# Patient Record
Sex: Male | Born: 1965 | Race: White | Hispanic: Yes | Marital: Married | Smoking: Never smoker
Health system: Southern US, Community
[De-identification: ages and names within clinical notes are randomized; demographics above are authoritative.]

## PROBLEM LIST (undated history)

## (undated) DIAGNOSIS — M65831 Other synovitis and tenosynovitis, right forearm: Secondary | ICD-10-CM

## (undated) DIAGNOSIS — E785 Hyperlipidemia, unspecified: Secondary | ICD-10-CM

## (undated) DIAGNOSIS — Z9189 Other specified personal risk factors, not elsewhere classified: Secondary | ICD-10-CM

## (undated) DIAGNOSIS — K219 Gastro-esophageal reflux disease without esophagitis: Secondary | ICD-10-CM

## (undated) DIAGNOSIS — Z973 Presence of spectacles and contact lenses: Secondary | ICD-10-CM

## (undated) DIAGNOSIS — I10 Essential (primary) hypertension: Secondary | ICD-10-CM

## (undated) HISTORY — DX: Gastro-esophageal reflux disease without esophagitis: K21.9

## (undated) HISTORY — DX: Hyperlipidemia, unspecified: E78.5

---

## 2006-06-06 ENCOUNTER — Ambulatory Visit: Payer: Self-pay | Admitting: Internal Medicine

## 2006-08-31 ENCOUNTER — Ambulatory Visit: Payer: Self-pay | Admitting: Internal Medicine

## 2006-09-29 ENCOUNTER — Ambulatory Visit: Payer: Self-pay | Admitting: Internal Medicine

## 2006-10-24 ENCOUNTER — Ambulatory Visit: Payer: Self-pay | Admitting: Internal Medicine

## 2006-11-16 ENCOUNTER — Ambulatory Visit: Payer: Self-pay | Admitting: Internal Medicine

## 2006-11-26 ENCOUNTER — Emergency Department (HOSPITAL_COMMUNITY): Admission: EM | Admit: 2006-11-26 | Discharge: 2006-11-26 | Payer: Self-pay | Admitting: Emergency Medicine

## 2007-01-03 ENCOUNTER — Ambulatory Visit: Payer: Self-pay | Admitting: Internal Medicine

## 2007-09-22 ENCOUNTER — Ambulatory Visit: Payer: Self-pay | Admitting: Family Medicine

## 2008-05-14 ENCOUNTER — Ambulatory Visit: Payer: Self-pay | Admitting: Internal Medicine

## 2008-05-14 DIAGNOSIS — K219 Gastro-esophageal reflux disease without esophagitis: Secondary | ICD-10-CM | POA: Insufficient documentation

## 2008-08-25 ENCOUNTER — Ambulatory Visit: Payer: Self-pay | Admitting: Internal Medicine

## 2008-10-17 HISTORY — PX: CARPAL TUNNEL RELEASE: SHX101

## 2008-12-09 ENCOUNTER — Ambulatory Visit: Payer: Self-pay | Admitting: Internal Medicine

## 2008-12-09 DIAGNOSIS — R209 Unspecified disturbances of skin sensation: Secondary | ICD-10-CM | POA: Insufficient documentation

## 2008-12-09 DIAGNOSIS — M79609 Pain in unspecified limb: Secondary | ICD-10-CM

## 2008-12-16 ENCOUNTER — Encounter (INDEPENDENT_AMBULATORY_CARE_PROVIDER_SITE_OTHER): Payer: Self-pay | Admitting: *Deleted

## 2008-12-16 LAB — CONVERTED CEMR LAB
Calcium: 9.7 mg/dL (ref 8.4–10.5)
Creatinine, Ser: 0.9 mg/dL (ref 0.4–1.5)
Folate: 8.3 ng/mL
GFR calc non Af Amer: 97 mL/min
Glucose, Bld: 94 mg/dL (ref 70–99)
Sodium: 141 meq/L (ref 135–145)
TSH: 2.54 microintl units/mL (ref 0.35–5.50)

## 2010-08-11 ENCOUNTER — Ambulatory Visit: Payer: Self-pay | Admitting: Internal Medicine

## 2010-08-11 ENCOUNTER — Encounter: Payer: Self-pay | Admitting: Internal Medicine

## 2010-08-12 ENCOUNTER — Ambulatory Visit: Payer: Self-pay | Admitting: Internal Medicine

## 2010-08-18 LAB — CONVERTED CEMR LAB
AST: 28 units/L (ref 0–37)
BUN: 22 mg/dL (ref 6–23)
Basophils Absolute: 0 10*3/uL (ref 0.0–0.1)
CO2: 28 meq/L (ref 19–32)
Calcium: 9.2 mg/dL (ref 8.4–10.5)
Direct LDL: 162.8 mg/dL
Eosinophils Absolute: 0.1 10*3/uL (ref 0.0–0.7)
Eosinophils Relative: 2 % (ref 0.0–5.0)
HDL: 39.4 mg/dL (ref >39.00)
Hemoglobin: 14.7 g/dL (ref 13.0–17.0)
Neutrophils Relative %: 52.7 % (ref 43.0–77.0)
RDW: 14.1 % (ref 11.5–14.6)
Sodium: 136 meq/L (ref 135–145)
Total CHOL/HDL Ratio: 6
Triglycerides: 120 mg/dL (ref 0.0–149.0)
VLDL: 24 mg/dL (ref 0.0–40.0)

## 2010-11-16 NOTE — Assessment & Plan Note (Signed)
Summary: CPX///SPH   Vital Signs:  Patient profile:   45 year old male Height:      68 inches Weight:      192.50 pounds BMI:     29.38 Pulse rate:   66 / minute Pulse rhythm:   regular BP sitting:   127 / 76  Vitals Entered By: Army Fossa CMA (August 11, 2010 3:05 PM) CC: CPX, not fasting Comments discuss TD declines flu shot    History of Present Illness: CPX   Preventive Screening-Counseling & Management  Alcohol-Tobacco     Smoking Status: never  Caffeine-Diet-Exercise     Does Patient Exercise: yes     Type of exercise: active at work  Current Medications (verified): 1)  None  Allergies (verified): No Known Drug Allergies  Past History:  Past Medical History: Reviewed history from 08/25/2008 and no changes required. no  Past Surgical History: R wrist surgery 2010 (?CTS)  Family History: DM-- GPs, uncles  HTN-- GPs, uncles  MI-- ? colon ca-- no prostate ca-- no  Social History: original from [place] married children x 1  tobacco--no ETOH-- rarely diet-- low salt otherwise regular  exercise-- vey active at work, no regular exercise.  Works in [employer] Smoking Status:  never Does Patient Exercise:  yes  Review of Systems General:  Denies fatigue, fever, and weight loss. CV:  Denies chest pain or discomfort, palpitations, and swelling of feet. Resp:  Denies cough and shortness of breath. GI:  Denies bloody stools, diarrhea, nausea, and vomiting. GU:  Denies dysuria and hematuria. Psych:  Denies anxiety and depression.  Physical Exam  General:  alert, well-developed, and well-nourished.   Neck:  no masses, no thyromegaly, and normal carotid upstroke.   Lungs:  normal respiratory effort, no intercostal retractions, no accessory muscle use, and normal breath sounds.   Heart:  normal rate, regular rhythm, no murmur, and no gallop.   Abdomen:  soft, non-tender, no distention, no masses, no guarding, and no rigidity.   Extremities:   no lower extremity edema Psych:  Oriented X3, memory intact for recent and remote, normally interactive, good eye contact, not anxious appearing, and not depressed appearing.     Impression & Recommendations:  Problem # 1:  ROUTINE GENERAL MEDICAL EXAM@HEALTH  CARE FACL (ICD-V70.0)  Td-- today flu shot-- declined , explained benefites   never had a colonoscopy  Exercise discussed Labs EKG--nsr   Orders: EKG w/ Interpretation (93000)  Other Orders: Tdap => 99yrs IM (16109) Admin 1st Vaccine (60454)  Patient Instructions: 1)  came back  fasting 2)  FLP, CBC, BMP, TSH, AST, ALT--- v70 3)  Please schedule a follow-up appointment in 1 year.    Orders Added: 1)  Tdap => 75yrs IM [90715] 2)  Admin 1st Vaccine [90471] 3)  EKG w/ Interpretation [93000] 4)  Est. Patient age 6-64 [54]   Immunizations Administered:  Tetanus Vaccine:    Vaccine Type: Tdap    Site: right deltoid    Mfr: GlaxoSmithKline    Dose: 0.5 ml    Route: IM    Given by: Army Fossa CMA    Exp. Date: 08/05/2012    Lot #: [id number]   Immunizations Administered:  Tetanus Vaccine:    Vaccine Type: Tdap    Site: right deltoid    Mfr: GlaxoSmithKline    Dose: 0.5 ml    Route: IM    Given by: Army Fossa CMA    Exp. Date: 08/05/2012    Lot #: [id number]  Risk Factors:  Tobacco use:  never Alcohol use:  no Exercise:  yes    Type:  active at work

## 2011-03-04 NOTE — Assessment & Plan Note (Signed)
[facility]                          [facility] OFFICE NOTE   WILBURN, KEIR                         MRN:          [id number]  DATE:06/06/2006                            DOB:          [birth year]    CHIEF COMPLAINT:  Hand numbness.   HISTORY OF PRESENT ILLNESS:  Mr. Grima is a 45 year old Hispanic male,  originally from [place], who came to the office with a one-year history of  bilateral hand cramps and numbness.  These have been mostly when he is at  work and also occasionally at night.  He works as a Corporate investment banker.  Also, he has noticed diarrhea on and off.  It is mostly related to milk, and  it can be severe and watery.  He has not symptoms with cheese or other dairy  products.   PAST MEDICAL HISTORY:  The only surgery he had before is a correction of a  deviated septum.   FAMILY HISTORY:  1. Denies any history of colon or prostate cancer.  No coronary artery      disease.  2. Grandpas have history of hypertension and diabetes.   SOCIAL HISTORY:  Does not smoke and drinks socially.  He is married and has  2 kids of his own.   REVIEW OF SYSTEMS:  He denies any elbow or wrist pain.  No blood in the  stools or abdominal pain.   MEDICATIONS:  None.   ALLERGIES:  No known drug allergies.   PHYSICAL EXAMINATION:  GENERAL:  The patient is alert and oriented, in no  apparent distress.  VITAL SIGNS:  He is 5 feet 7 inches tall, he weighs 195 pounds.  Blood  pressure 140/92 on the left arm, pulse 78.  LUNGS:  Clear to auscultation bilaterally.  CARDIOVASCULAR:  Regular rate and rhythm without a murmur.  EXTREMITIES:  There is no wrist or hand puffiness, deformation or swelling.  Range of motion of the elbows and wrists is normal.  There are no  subcutaneous nodules on the upper extremities.  I did a percussion in the  wrists and he did feel some paresthesias when I did that.  Hyperextension of  the wrists did not reproduce the  numbness on the hands.   ASSESSMENT AND PLAN:  1. The patient has paresthesias.  The symptoms are somehow atypical for      carpal tunnel syndrome as the paresthesias are in all fingers.      Nevertheless, I recommend him to use splints on his wrists at night and      as much as he can in the daytime.  He is to let me know if that does      not help.  2. He has lactose intolerance by history.  I advised him to take Lactaid      whenever he drinks milk or certain foods that he knows are going to      cause diarrhea.  3. His blood pressure is slightly elevated today.  I recommend him to have      a complete physical at some point  in the next few months.                                   Willow Ora, MD   JP/MedQ  DD:  06/06/2006  DT:  06/07/2006  Job #:  [id number]

## 2011-04-02 ENCOUNTER — Emergency Department (HOSPITAL_COMMUNITY): Payer: BC Managed Care – PPO

## 2011-04-02 ENCOUNTER — Emergency Department (HOSPITAL_COMMUNITY)
Admission: EM | Admit: 2011-04-02 | Discharge: 2011-04-03 | Disposition: A | Discharge status: Home / Self Care | Payer: BC Managed Care – PPO | Attending: Emergency Medicine | Admitting: Emergency Medicine

## 2011-04-02 DIAGNOSIS — S61509A Unspecified open wound of unspecified wrist, initial encounter: Secondary | ICD-10-CM | POA: Insufficient documentation

## 2011-04-02 DIAGNOSIS — Y9289 Other specified places as the place of occurrence of the external cause: Secondary | ICD-10-CM | POA: Insufficient documentation

## 2011-04-02 DIAGNOSIS — M25539 Pain in unspecified wrist: Secondary | ICD-10-CM | POA: Insufficient documentation

## 2011-04-02 DIAGNOSIS — M79609 Pain in unspecified limb: Secondary | ICD-10-CM | POA: Insufficient documentation

## 2011-04-02 DIAGNOSIS — S6000XA Contusion of unspecified finger without damage to nail, initial encounter: Secondary | ICD-10-CM | POA: Insufficient documentation

## 2012-10-17 HISTORY — PX: NASAL SEPTUM SURGERY: SHX37

## 2013-03-13 ENCOUNTER — Ambulatory Visit: Payer: BC Managed Care – PPO | Admitting: Internal Medicine

## 2013-06-03 ENCOUNTER — Ambulatory Visit: Payer: BC Managed Care – PPO | Admitting: Internal Medicine

## 2013-06-07 ENCOUNTER — Ambulatory Visit: Payer: BC Managed Care – PPO | Admitting: Internal Medicine

## 2014-05-29 ENCOUNTER — Encounter (HOSPITAL_BASED_OUTPATIENT_CLINIC_OR_DEPARTMENT_OTHER): Payer: Self-pay | Admitting: *Deleted

## 2014-05-29 NOTE — Progress Notes (Signed)
05/29/14 1654  OBSTRUCTIVE SLEEP APNEA  Have you ever been diagnosed with sleep apnea through a sleep study? No  Do you snore loudly (loud enough to be heard through closed doors)?  1  Do you often feel tired, fatigued, or sleepy during the daytime? 0  Has anyone observed you stop breathing during your sleep? 0  Do you have, or are you being treated for high blood pressure? 1  BMI more than 35 kg/m2? 0  Age over 50 years old? 0  Neck circumference greater than 40 cm/16 inches? 1  Gender: 1  Obstructive Sleep Apnea Score 4  Score 4 or greater  Results sent to PCP

## 2014-05-29 NOTE — Progress Notes (Signed)
NPO AFTER MN. ARRIVE AT 1100. NEEDS ISTAT AND EKG.

## 2014-06-04 NOTE — Brief Op Note (Signed)
06/05/2014  9:18 PM  PATIENT:  Jerry Sims  48 y.o. male  PRE-OPERATIVE DIAGNOSIS:  RIGHT WRIST EXTENSOR TENOSYNOVITIS  POST-OPERATIVE DIAGNOSIS:  * No post-op diagnosis entered *  PROCEDURE:  Procedure(s):  RIGHT WRIST FIRST/SECOND DORSAL COMPARTMENT TENOSYNOVECTOMY (Right)  SURGEON:  Surgeon(s) and Role:    * Linna Hoff, MD - Primary  PHYSICIAN ASSISTANT:   ASSISTANTS: none   ANESTHESIA:   general  EBL:     BLOOD ADMINISTERED:none  DRAINS: none   LOCAL MEDICATIONS USED:  MARCAINE     SPECIMEN:  No Specimen  DISPOSITION OF SPECIMEN:  N/A  COUNTS:  YES  TOURNIQUET:  * No tourniquets in log *  DICTATION: .Other Dictation: Dictation Number [id number]  PLAN OF CARE: Discharge to home after PACU  PATIENT DISPOSITION:  PACU - hemodynamically stable.   Delay start of Pharmacological VTE agent (>24hrs) due to surgical blood loss or risk of bleeding: not applicable

## 2014-06-04 NOTE — H&P (Signed)
Jerry Sims is an 48 y.o. male.   Chief Complaint: Right wrist dorsal radial wrist pain HPI: Pt with persistent dorsal radial wrist pain Pt has been followed in office Pt here for surgery for persistent pain  Past Medical History  Diagnosis Date  . Extensor tenosynovitis of right wrist   . Hypertension   . Wears glasses   . At risk for sleep apnea     STOP-BANG= 4     SENT TO PCP 05-29-2014    Past Surgical History  Procedure Laterality Date  . Carpal tunnel release Right 2010  . Nasal septum surgery  2014    History reviewed. No pertinent family history. Social History:  reports that he has never smoked. He has never used smokeless tobacco. He reports that he drinks alcohol. He reports that he does not use illicit drugs.  Allergies: No Known Allergies  No prescriptions prior to admission    No results found for this or any previous visit (from the past 48 hour(s)). No results found.  ROS NO RECENT ILLNESSES OR HOSPITALIZATIONS  Height 5\' 5"  (1.651 m), weight 81.647 kg (180 lb). Physical Exam  General Appearance:  Alert, cooperative, no distress, appears stated age  Head:  Normocephalic, without obvious abnormality, atraumatic  Eyes:  Pupils equal, conjunctiva/corneas clear,         Throat: Lips, mucosa, and tongue normal; teeth and gums normal  Neck: No visible masses     Lungs:   respirations unlabored  Chest Wall:  No tenderness or deformity  Heart:  Regular rate and rhythm,  Abdomen:   Soft, non-tender,         Extremities: RIGHT WRIST: TTP OVER FIRST DORSAL COMPARTMENT, AND INTERSECTION REGION ABLE TO EXTEND THUMB. FINGERS WARM WELL PERFUSED GOOD WRIST MOBILITY  Pulses: 2+ and symmetric  Skin: Skin color, texture, turgor normal, no rashes or lesions     Neurologic: Normal    Assessment/Plan RIGHT WRIST FIRST DORSAL COMPARTMENT AND SECOND DORSAL COMPARTMENT TENOSYNOVITIS  RIGHT WRIST TENOSYNOVITIS  RIGHT WRIST TENOSYNOVECTOMY  R/B/A DISCUSSED  WITH PT IN OFFICE.  PT VOICED UNDERSTANDING OF PLAN CONSENT SIGNED DAY OF SURGERY PT SEEN AND EXAMINED PRIOR TO OPERATIVE PROCEDURE/DAY OF SURGERY SITE MARKED. QUESTIONS ANSWERED WILL GO HOME FOLLOWING SURGERY  WE ARE PLANNING SURGERY FOR YOUR UPPER EXTREMITY. THE RISKS AND BENEFITS OF SURGERY INCLUDE BUT NOT LIMITED TO BLEEDING INFECTION, DAMAGE TO NEARBY NERVES ARTERIES TENDONS, FAILURE OF SURGERY TO ACCOMPLISH ITS INTENDED GOALS, PERSISTENT SYMPTOMS AND NEED FOR FURTHER SURGICAL INTERVENTION. WITH THIS IN MIND WE WILL PROCEED. I HAVE DISCUSSED WITH THE PATIENT THE PRE AND POSTOPERATIVE REGIMEN AND THE DOS AND DON'TS. PT VOICED UNDERSTANDING AND INFORMED CONSENT SIGNED.  Linna Hoff 06/05/2014 AT 1245

## 2014-06-05 ENCOUNTER — Ambulatory Visit (HOSPITAL_BASED_OUTPATIENT_CLINIC_OR_DEPARTMENT_OTHER): Payer: 59 | Admitting: Anesthesiology

## 2014-06-05 ENCOUNTER — Encounter (HOSPITAL_BASED_OUTPATIENT_CLINIC_OR_DEPARTMENT_OTHER): Payer: Self-pay

## 2014-06-05 ENCOUNTER — Ambulatory Visit (HOSPITAL_BASED_OUTPATIENT_CLINIC_OR_DEPARTMENT_OTHER)
Admission: RE | Admit: 2014-06-05 | Discharge: 2014-06-05 | Disposition: A | Discharge status: Home / Self Care | Payer: 59 | Source: Ambulatory Visit | Attending: Orthopedic Surgery | Admitting: Orthopedic Surgery

## 2014-06-05 ENCOUNTER — Encounter (HOSPITAL_BASED_OUTPATIENT_CLINIC_OR_DEPARTMENT_OTHER)
Admission: RE | Disposition: A | Discharge status: Home / Self Care | Payer: Self-pay | Source: Ambulatory Visit | Attending: Orthopedic Surgery

## 2014-06-05 ENCOUNTER — Encounter (HOSPITAL_BASED_OUTPATIENT_CLINIC_OR_DEPARTMENT_OTHER): Payer: 59 | Admitting: Anesthesiology

## 2014-06-05 DIAGNOSIS — M65839 Other synovitis and tenosynovitis, unspecified forearm: Secondary | ICD-10-CM | POA: Diagnosis not present

## 2014-06-05 DIAGNOSIS — M65849 Other synovitis and tenosynovitis, unspecified hand: Secondary | ICD-10-CM | POA: Diagnosis present

## 2014-06-05 DIAGNOSIS — I10 Essential (primary) hypertension: Secondary | ICD-10-CM | POA: Insufficient documentation

## 2014-06-05 DIAGNOSIS — K219 Gastro-esophageal reflux disease without esophagitis: Secondary | ICD-10-CM | POA: Diagnosis not present

## 2014-06-05 DIAGNOSIS — M65831 Other synovitis and tenosynovitis, right forearm: Secondary | ICD-10-CM

## 2014-06-05 HISTORY — DX: Presence of spectacles and contact lenses: Z97.3

## 2014-06-05 HISTORY — DX: Essential (primary) hypertension: I10

## 2014-06-05 HISTORY — PX: DORSAL COMPARTMENT RELEASE: SHX5039

## 2014-06-05 HISTORY — DX: Other synovitis and tenosynovitis, right forearm: M65.831

## 2014-06-05 HISTORY — DX: Other specified personal risk factors, not elsewhere classified: Z91.89

## 2014-06-05 LAB — POCT I-STAT 4, (NA,K, GLUC, HGB,HCT)
GLUCOSE: 115 mg/dL — AB (ref 70–99)
Glucose, Bld: 115 mg/dL — ABNORMAL HIGH (ref 70–99)
HCT: 45 % (ref 39.0–52.0)
HEMATOCRIT: 49 % (ref 39.0–52.0)
Hemoglobin: 15.3 g/dL (ref 13.0–17.0)
Hemoglobin: 16.7 g/dL (ref 13.0–17.0)
POTASSIUM: 4 meq/L (ref 3.7–5.3)
Potassium: 6.1 mEq/L — ABNORMAL HIGH (ref 3.7–5.3)
SODIUM: 142 meq/L (ref 137–147)
Sodium: 137 mEq/L (ref 137–147)

## 2014-06-05 SURGERY — RELEASE, FIRST DORSAL COMPARTMENT, HAND
Anesthesia: General | Site: Wrist | Laterality: Right

## 2014-06-05 MED ORDER — BUPIVACAINE HCL (PF) 0.25 % IJ SOLN
INTRAMUSCULAR | Status: DC | PRN
  Administered 2014-06-05: 11 mL

## 2014-06-05 MED ORDER — MIDAZOLAM HCL 5 MG/5ML IJ SOLN
INTRAMUSCULAR | Status: DC | PRN
  Administered 2014-06-05: 2 mg via INTRAVENOUS

## 2014-06-05 MED ORDER — CHLORHEXIDINE GLUCONATE 4 % EX LIQD
60.0000 mL | Freq: Once | CUTANEOUS | Status: DC
  Filled 2014-06-05: qty 60

## 2014-06-05 MED ORDER — FENTANYL CITRATE 0.05 MG/ML IJ SOLN
INTRAMUSCULAR | Status: DC | PRN
  Administered 2014-06-05 (×2): 50 ug via INTRAVENOUS
  Administered 2014-06-05 (×2): 25 ug via INTRAVENOUS

## 2014-06-05 MED ORDER — PROPOFOL 10 MG/ML IV BOLUS
INTRAVENOUS | Status: DC | PRN
  Administered 2014-06-05: 200 mg via INTRAVENOUS

## 2014-06-05 MED ORDER — FENTANYL CITRATE 0.05 MG/ML IJ SOLN
INTRAMUSCULAR | Status: AC
  Filled 2014-06-05: qty 4

## 2014-06-05 MED ORDER — ONDANSETRON HCL 4 MG/2ML IJ SOLN
INTRAMUSCULAR | Status: DC | PRN
  Administered 2014-06-05: 4 mg via INTRAVENOUS

## 2014-06-05 MED ORDER — HYDROCODONE-ACETAMINOPHEN 5-325 MG PO TABS
ORAL_TABLET | ORAL | Status: AC
  Filled 2014-06-05: qty 1

## 2014-06-05 MED ORDER — CEFAZOLIN SODIUM-DEXTROSE 2-3 GM-% IV SOLR
2.0000 g | INTRAVENOUS | Status: AC
  Administered 2014-06-05: 2 g via INTRAVENOUS
  Filled 2014-06-05: qty 50

## 2014-06-05 MED ORDER — HYDROCODONE-ACETAMINOPHEN 5-300 MG PO TABS: 1.0000 | ORAL_TABLET | Freq: Four times a day (QID) | ORAL | Status: DC | PRN

## 2014-06-05 MED ORDER — DEXAMETHASONE SODIUM PHOSPHATE 4 MG/ML IJ SOLN
INTRAMUSCULAR | Status: DC | PRN
  Administered 2014-06-05: 10 mg via INTRAVENOUS

## 2014-06-05 MED ORDER — MIDAZOLAM HCL 2 MG/2ML IJ SOLN
INTRAMUSCULAR | Status: AC
  Filled 2014-06-05: qty 2

## 2014-06-05 MED ORDER — LIDOCAINE HCL (CARDIAC) 20 MG/ML IV SOLN
INTRAVENOUS | Status: DC | PRN
  Administered 2014-06-05: 100 mg via INTRAVENOUS

## 2014-06-05 MED ORDER — LACTATED RINGERS IV SOLN
INTRAVENOUS | Status: DC
  Administered 2014-06-05: 12:00:00 via INTRAVENOUS
  Filled 2014-06-05: qty 1000

## 2014-06-05 MED ORDER — HYDROCODONE-ACETAMINOPHEN 5-325 MG PO TABS
1.0000 | ORAL_TABLET | ORAL | Status: DC | PRN
  Administered 2014-06-05: 1 via ORAL
  Filled 2014-06-05: qty 1

## 2014-06-05 SURGICAL SUPPLY — 57 items
APL SKNCLS STERI-STRIP NONHPOA (GAUZE/BANDAGES/DRESSINGS) ×1
BANDAGE ELASTIC 3 VELCRO ST LF (GAUZE/BANDAGES/DRESSINGS) ×3 IMPLANT
BENZOIN TINCTURE PRP APPL 2/3 (GAUZE/BANDAGES/DRESSINGS) ×2 IMPLANT
BLADE SURG 15 STRL LF DISP TIS (BLADE) ×1 IMPLANT
BLADE SURG 15 STRL SS (BLADE) ×3
BNDG CMPR 9X4 STRL LF SNTH (GAUZE/BANDAGES/DRESSINGS) ×1
BNDG CMPR MD 5X2 ELC HKLP STRL (GAUZE/BANDAGES/DRESSINGS) ×1
BNDG CONFORM 3 STRL LF (GAUZE/BANDAGES/DRESSINGS) ×3 IMPLANT
BNDG ELASTIC 2 VLCR STRL LF (GAUZE/BANDAGES/DRESSINGS) ×2 IMPLANT
BNDG ESMARK 4X9 LF (GAUZE/BANDAGES/DRESSINGS) ×3 IMPLANT
CLOSURE WOUND 1/2 X4 (GAUZE/BANDAGES/DRESSINGS) ×1
CORDS BIPOLAR (ELECTRODE) ×3 IMPLANT
COVER TABLE BACK 60X90 (DRAPES) ×3 IMPLANT
CUFF TOURNIQUET SINGLE 18IN (TOURNIQUET CUFF) ×2 IMPLANT
DRAPE EXTREMITY T 121X128X90 (DRAPE) ×3 IMPLANT
DRAPE LG THREE QUARTER DISP (DRAPES) ×3 IMPLANT
DRAPE SURG 17X23 STRL (DRAPES) ×5 IMPLANT
DRSG EMULSION OIL 3X3 NADH (GAUZE/BANDAGES/DRESSINGS) IMPLANT
GAUZE XEROFORM 1X8 LF (GAUZE/BANDAGES/DRESSINGS) ×3 IMPLANT
GLOVE BIO SURGEON STRL SZ8 (GLOVE) ×3 IMPLANT
GLOVE BIOGEL M 6.5 STRL (GLOVE) ×2 IMPLANT
GLOVE BIOGEL PI IND STRL 6.5 (GLOVE) IMPLANT
GLOVE BIOGEL PI IND STRL 7.0 (GLOVE) IMPLANT
GLOVE BIOGEL PI IND STRL 8.5 (GLOVE) ×1 IMPLANT
GLOVE BIOGEL PI INDICATOR 6.5 (GLOVE) ×2
GLOVE BIOGEL PI INDICATOR 7.0 (GLOVE) ×2
GLOVE BIOGEL PI INDICATOR 8.5 (GLOVE) ×2
GOWN STRL REUS W/ TWL LRG LVL3 (GOWN DISPOSABLE) ×1 IMPLANT
GOWN STRL REUS W/ TWL XL LVL3 (GOWN DISPOSABLE) ×1 IMPLANT
GOWN STRL REUS W/TWL LRG LVL3 (GOWN DISPOSABLE) ×3
GOWN STRL REUS W/TWL XL LVL3 (GOWN DISPOSABLE) ×3
KNIFE CARPAL TUNNEL (BLADE) IMPLANT
NDL HYPO 25X1 1.5 SAFETY (NEEDLE) ×2 IMPLANT
NDL SAFETY ECLIPSE 18X1.5 (NEEDLE) IMPLANT
NEEDLE HYPO 18GX1.5 SHARP (NEEDLE)
NEEDLE HYPO 25X1 1.5 SAFETY (NEEDLE) ×6 IMPLANT
NS IRRIG 500ML POUR BTL (IV SOLUTION) ×3 IMPLANT
PACK BASIN DAY SURGERY FS (CUSTOM PROCEDURE TRAY) ×3 IMPLANT
PAD ALCOHOL SWAB (MISCELLANEOUS) ×4 IMPLANT
PAD CAST 3X4 CTTN HI CHSV (CAST SUPPLIES) IMPLANT
PADDING CAST ABS 3INX4YD NS (CAST SUPPLIES) ×2
PADDING CAST ABS COTTON 3X4 (CAST SUPPLIES) IMPLANT
PADDING CAST COTTON 3X4 STRL (CAST SUPPLIES) ×3
PADDING UNDERCAST 2 STRL (CAST SUPPLIES) ×2
PADDING UNDERCAST 2X4 STRL (CAST SUPPLIES) IMPLANT
SPLINT PLASTER CAST XFAST 3X15 (CAST SUPPLIES) IMPLANT
SPLINT PLASTER XTRA FASTSET 3X (CAST SUPPLIES) ×2
SPONGE GAUZE 4X4 12PLY STER LF (GAUZE/BANDAGES/DRESSINGS) ×3 IMPLANT
STOCKINETTE 4X48 STRL (DRAPES) ×3 IMPLANT
STRIP CLOSURE SKIN 1/2X4 (GAUZE/BANDAGES/DRESSINGS) ×1 IMPLANT
SUT MON AB-0 CT1 36 (SUTURE) ×2 IMPLANT
SUT PROLENE 4 0 PS 2 18 (SUTURE) ×3 IMPLANT
SYR BULB 3OZ (MISCELLANEOUS) ×3 IMPLANT
SYR CONTROL 10ML LL (SYRINGE) ×6 IMPLANT
TOWEL OR 17X24 6PK STRL BLUE (TOWEL DISPOSABLE) ×3 IMPLANT
TRAY DSU PREP LF (CUSTOM PROCEDURE TRAY) ×3 IMPLANT
UNDERPAD 30X30 INCONTINENT (UNDERPADS AND DIAPERS) ×3 IMPLANT

## 2014-06-05 NOTE — Transfer of Care (Signed)
Immediate Anesthesia Transfer of Care Note  Patient: Jerry Sims  Procedure(s) Performed: Procedure(s) (LRB):  RIGHT WRIST FIRST/SECOND DORSAL COMPARTMENT TENOSYNOVECTOMY (Right)  Patient Location: PACU  Anesthesia Type: General  Level of Consciousness: awake, alert  and oriented  Airway & Oxygen Therapy: Patient Spontanous Breathing and Patient connected to nasal cannula oxygen  Post-op Assessment: Report given to PACU RN and Post -op Vital signs reviewed and stable  Post vital signs: Reviewed and stable  Complications: No apparent anesthesia complications

## 2014-06-05 NOTE — Discharge Instructions (Addendum)
KEEP BANDAGE CLEAN AND DRY CALL OFFICE FOR F/U APPT [phone] KEEP HAND ELEVATED ABOVE HEART OK TO APPLY ICE TO OPERATIVE AREA CONTACT OFFICE IF ANY WORSENING PAIN OR CONCERNS.  Post Anesthesia Home Care Instructions  Activity: Get plenty of rest for the remainder of the day. A responsible adult should stay with you for 24 hours following the procedure.  For the next 24 hours, DO NOT: -Drive a car -Paediatric nurse -Drink alcoholic beverages -Take any medication unless instructed by your physician -Make any legal decisions or sign important papers.  Meals: Start with liquid foods such as gelatin or soup. Progress to regular foods as tolerated. Avoid greasy, spicy, heavy foods. If nausea and/or vomiting occur, drink only clear liquids until the nausea and/or vomiting subsides. Call your physician if vomiting continues.  Special Instructions/Symptoms: Your throat may feel dry or sore from the anesthesia or the breathing tube placed in your throat during surgery. If this causes discomfort, gargle with warm salt water. The discomfort should disappear within 24 hours.       HAND SURGERY    HOME CARE INSTRUCTIONS    The following instructions have been prepared to help you care for yourself upon your return home today.  Wound Care:  Keep your hand elevated above the level of your heart. Do not allow it to dangle by your side. Keep the dressing dry and do not remove it unless your doctor advises you to do so. He will usually change it at the time of you post-op visit. Moving your fingers is advised to stimulate circulation but will depend on the site of your surgery. Of course, if you have a splint applied your doctor will advise you about movement.  Activity:  Do not drive or operate machinery today. Rest today and then you may return to your normal activity and work as indicated by your physician.  Diet: Drink liquids today or eat a light diet. You may resume a regular diet  tomorrow.  General expectations: Pain for two or three days. Fingers may become slightly swollen.   Unexpected Observations- Call your doctor if any of these occur: Severe pain not relieved by pain medication. Elevated temperature. Dressing soaked with blood. Inability to move fingers. White or bluish color to fingers.

## 2014-06-05 NOTE — Anesthesia Procedure Notes (Signed)
Procedure Name: LMA Insertion Date/Time: 06/05/2014 12:56 PM Performed by: Mechele Claude Pre-anesthesia Checklist: Patient identified, Emergency Drugs available, Suction available and Patient being monitored Patient Re-evaluated:Patient Re-evaluated prior to inductionOxygen Delivery Method: Circle System Utilized Preoxygenation: Pre-oxygenation with 100% oxygen Intubation Type: IV induction Ventilation: Mask ventilation without difficulty LMA: LMA inserted LMA Size: 4.0 Number of attempts: 1 Airway Equipment and Method: bite block Placement Confirmation: positive ETCO2 Tube secured with: Tape Dental Injury: Teeth and Oropharynx as per pre-operative assessment

## 2014-06-05 NOTE — Anesthesia Preprocedure Evaluation (Addendum)
Anesthesia Evaluation  Patient identified by MRN, date of birth, ID band Patient awake    Reviewed: Allergy & Precautions, H&P , NPO status , Patient's Chart, lab work & pertinent test results  Airway Mallampati: I TM Distance: >3 FB Neck ROM: Full    Dental  (+) Teeth Intact, Dental Advisory Given   Pulmonary neg pulmonary ROS,  breath sounds clear to auscultation        Cardiovascular hypertension, Pt. on medications Rhythm:Regular Rate:Normal     Neuro/Psych negative neurological ROS  negative psych ROS   GI/Hepatic Neg liver ROS, GERD-  ,  Endo/Other  negative endocrine ROS  Renal/GU negative Renal ROS  negative genitourinary   Musculoskeletal negative musculoskeletal ROS (+)   Abdominal   Peds  Hematology negative hematology ROS (+)   Anesthesia Other Findings   Reproductive/Obstetrics                          Anesthesia Physical Anesthesia Plan  ASA: II  Anesthesia Plan: General   Post-op Pain Management:    Induction: Intravenous  Airway Management Planned: LMA  Additional Equipment: None  Intra-op Plan:   Post-operative Plan:   Informed Consent: I have reviewed the patients History and Physical, chart, labs and discussed the procedure including the risks, benefits and alternatives for the proposed anesthesia with the patient or authorized representative who has indicated his/her understanding and acceptance.   Dental advisory given  Plan Discussed with: CRNA  Anesthesia Plan Comments:         Anesthesia Quick Evaluation

## 2014-06-06 ENCOUNTER — Encounter (HOSPITAL_BASED_OUTPATIENT_CLINIC_OR_DEPARTMENT_OTHER): Payer: Self-pay | Admitting: Orthopedic Surgery

## 2014-06-06 NOTE — Anesthesia Postprocedure Evaluation (Signed)
  Anesthesia Post-op Note  Patient: Jerry Sims  Procedure(s) Performed: Procedure(s) (LRB):  RIGHT WRIST FIRST/SECOND DORSAL COMPARTMENT TENOSYNOVECTOMY (Right)  Patient Location: PACU  Anesthesia Type: General  Level of Consciousness: awake and alert   Airway and Oxygen Therapy: Patient Spontanous Breathing  Post-op Pain: mild  Post-op Assessment: Post-op Vital signs reviewed, Patient's Cardiovascular Status Stable, Respiratory Function Stable, Patent Airway and No signs of Nausea or vomiting  Last Vitals:  Filed Vitals:   06/05/14 1545  BP: 114/75  Pulse: 60  Temp: 36.4 C  Resp: 16    Post-op Vital Signs: stable   Complications: No apparent anesthesia complications

## 2014-06-09 NOTE — Op Note (Signed)
NAMEJAYMON, Jerry Sims NO.:  [id number]  MEDICAL RECORD NO.:  [id number]  LOCATION:                                 FACILITY:  PHYSICIAN:  Melrose Nakayama, MD  DATE OF BIRTH:  05-11-1966  DATE OF PROCEDURE:  06/05/2014 DATE OF DISCHARGE:  06/05/2014                              OPERATIVE REPORT   PREOPERATIVE DIAGNOSES: 1. Right wrist, first dorsal compartment tenosynovitis. 2. Right wrist, second dorsal compartment tenosynovitis.  POSTOPERATIVE DIAGNOSES: 1. Right wrist, first dorsal compartment tenosynovitis. 2. Right wrist, second dorsal compartment tenosynovitis.  ATTENDING PHYSICIAN:  Melrose Nakayama, MD, who was scrubbed and present for the entire procedure.  ASSISTANT SURGEON:  None.  ANESTHESIA:  General via LMA.  PROCEDURE: 1. Right wrist, first dorsal compartment tenosynovectomy. 2. Right wrist, second dorsal compartment tendon sheath incision and     release.  SURGICAL INDICATIONS:  Jerry Sims is a right hand dominant gentleman with persistent radial-sided wrist pain.  The patient would like to undergo the above procedure.  Risks, benefits, and alternatives were discussed in detail with the patient and signed informed consent was obtained.  Risks include, but not limited to bleeding, infection, damage to nearby nerves, arteries, or tendons, loss of motion in the wrist and digits, incomplete relief of symptoms, and need for further surgical intervention.  INTRAOPERATIVE FINDINGS:  The patient did have moderate amount of inflammatory changes over the first dorsal compartment along EPB and several slips of the APL.  DESCRIPTION OF PROCEDURE:  The patient was properly identified in the preoperative holding area and marked with a permanent marker made on the right wrist to indicate correct operative site.  The patient was then brought back to the operating room, placed supine on the anesthesia room table where general anesthetic was  administered.  The patient tolerated this well.  A well-padded tourniquet was then placed on the right brachium sealed with 1000 drape.  Right upper extremity was then prepped and draped in normal sterile fashion.  Time-out was called.  Correct site was identified, and procedure then begun.  Attention was then turned to the right wrist.  A small longitudinal incision made directly over the first dorsal compartment.  Dissection was carried down through skin and subcutaneous tissue.  The limb was then elevated and tourniquet insufflated.  Dissection was carried down to the first dorsal compartment.  The tendon sheath was then carefully identified and careful dissection of the radial sensory nerve was then done throughout. Then the first dorsal compartment was then carefully opened up and the patient did have a separate sub sheath to the EPB and multiple slips of the APL.  Tenosynovectomy was then carried out at the APL and APB tendons.  The patient did have a large amount of inflammatory change along the course of both tendons and significant over crowding of the tendons within the first dorsal compartment.  The second dorsal compartment was then carefully elevated and tendon sheath release was then carried out.  The second dorsal compartment at the intersection region was released.  The wounds were then thoroughly irrigated.  After tenosynovectomy and compartmental releases, the subcutaneous tissues were closed  with 4-0 Monocryl.  Skin was closed with a running 4-0 Prolene subcuticular.  Benzoin and Steri-Strips were applied.  A 10 mL 0.25% Marcaine was infiltrated locally.  Sterile compressive bandage was then applied.  The patient was then placed in a well-padded thumb spica splint, extubated, and taken to recovery room in good condition.  POSTPROCEDURE PLAN:  The patient discharged home, to be seen back in the office in approximately 2 weeks for wound check, suture removal, and begin a  transition of short-arm brace, gradual use and activity.     Melrose Nakayama, MD     FWO/MEDQ  D:  06/05/2014  T:  06/05/2014  Job:  [id number]

## 2014-11-26 ENCOUNTER — Ambulatory Visit (INDEPENDENT_AMBULATORY_CARE_PROVIDER_SITE_OTHER): Payer: 59 | Admitting: Internal Medicine

## 2014-11-26 ENCOUNTER — Encounter: Payer: Self-pay | Admitting: Internal Medicine

## 2014-11-26 VITALS — BP 132/87 | HR 82 | Temp 97.9°F | Ht 65.0 in | Wt 199.5 lb

## 2014-11-26 DIAGNOSIS — Z0000 Encounter for general adult medical examination without abnormal findings: Secondary | ICD-10-CM

## 2014-11-26 DIAGNOSIS — Z9189 Other specified personal risk factors, not elsewhere classified: Secondary | ICD-10-CM

## 2014-11-26 DIAGNOSIS — I10 Essential (primary) hypertension: Secondary | ICD-10-CM

## 2014-11-26 MED ORDER — LISINOPRIL-HYDROCHLOROTHIAZIDE 10-12.5 MG PO TABS: 1.0000 | ORAL_TABLET | Freq: Every day | ORAL | Status: DC

## 2014-11-26 NOTE — Assessment & Plan Note (Signed)
Patient not seen since 2011, states he's taking medications daily prescribed elsewhere. BP today okay. Recommend no change.

## 2014-11-26 NOTE — Progress Notes (Signed)
Pre visit review using our clinic review tool, if applicable. No additional management support is needed unless otherwise documented below in the visit note. 

## 2014-11-26 NOTE — Assessment & Plan Note (Signed)
Had a positive screening for sleep apnea back in 2015, today the patient reports no snoring, he does not feel sleepy or fatigued.

## 2014-11-26 NOTE — Assessment & Plan Note (Signed)
Td 2011 Offered a flu shot Prostate cancer screening, DRE negative today, check a PSA Colon cancer screening: Never had a colonoscopy, we discussed Cscope vs iFob ; iFOB provided, will call if interested in a colonoscopy  Discussed diet and exercise Labs

## 2014-11-26 NOTE — Progress Notes (Signed)
Subjective:    Patient ID: Jerry Sims, male    DOB: [birth year], 49 y.o.   MRN: [id number]  DOS:  11/26/2014 Type of visit - description : New patient, requests a CPX Interval history: Patient has a history of high blood pressure, last seen in 2011, reports she has been taking his BP medication regularly prescribed elsewhere.   Review of Systems  Constitutional: Negative for diaphoresis, appetite change and unexpected weight change.  HENT: Negative for dental problem, ear discharge, facial swelling, trouble swallowing and voice change.   Eyes: Negative for photophobia, discharge and redness.  Respiratory:       No cough or sputum production. No wheezing or SOB  Cardiovascular:       No CP No edema or  palpitations   Gastrointestinal: Negative for nausea, vomiting, abdominal pain and diarrhea.       No blood in the stools   Endocrine: Negative for polydipsia, polyphagia and polyuria.  Genitourinary: Negative for urgency, frequency, hematuria and difficulty urinating.       No dysuria  Musculoskeletal: Negative for joint swelling.       No unusual aches or pains   Skin: Negative for color change, pallor and rash.  Allergic/Immunologic: Negative for environmental allergies and food allergies.  Neurological: Negative for dizziness and syncope.       No headaches   Hematological: Negative for adenopathy. Does not bruise/bleed easily.  Psychiatric/Behavioral: Negative for suicidal ideas, hallucinations, behavioral problems and confusion.       No unusual or severe anxiety-depression     Past Medical History  Diagnosis Date  . Extensor tenosynovitis of right wrist   . Hypertension   . Wears glasses   . At risk for sleep apnea     STOP-BANG= 4     SENT TO PCP 05-29-2014    Past Surgical History  Procedure Laterality Date  . Carpal tunnel release Right 2010  . Nasal septum surgery  2014  . Dorsal compartment release Right 06/05/2014    Procedure:  RIGHT WRIST FIRST/SECOND  DORSAL COMPARTMENT TENOSYNOVECTOMY;  Surgeon: Linna Hoff, MD;  Location: [facility];  Service: Orthopedics;  Laterality: Right;    History   Social History  . Marital Status: Married    Spouse Name: N/A  . Number of Children: 2  . Years of Education: N/A   Occupational History  . Architect, Sales executive     Social History Main Topics  . Smoking status: Never Smoker   . Smokeless tobacco: Never Used  . Alcohol Use: Yes     Comment: OCCASIONAL  . Drug Use: No  . Sexual Activity: Not on file   Other Topics Concern  . Not on file   Social History Narrative   Household-- pt, wife and his daughter    Original from [place]     Family History  Problem Relation Age of Onset  . Colon cancer Neg Hx   . Prostate cancer Neg Hx   . CAD Neg Hx   . Diabetes Other     GM, uncle        Medication List       This list is accurate as of: 11/26/14 11:59 PM.  Always use your most recent med list.               lisinopril-hydrochlorothiazide 10-12.5 MG per tablet  Commonly known as:  PRINZIDE,ZESTORETIC  Take 1 tablet by mouth daily.  Objective:   Physical Exam  Constitutional: He is oriented to person, place, and time. He appears well-developed. No distress.  HENT:  Head: Normocephalic and atraumatic.  Neck: Normal range of motion. Neck supple. No thyromegaly present.  Normal carotid pulses  Cardiovascular:  RRR, no murmur, rub or gallop  Pulmonary/Chest: Effort normal. No stridor. No respiratory distress.  CTA B  Abdominal: Soft. Bowel sounds are normal. He exhibits no distension and no mass. There is no tenderness. There is no rebound and no guarding.  Genitourinary:  Rectal: No external abnormalities noted. Normal sphincter tone. No rectal masses or tenderness.  Stool: brown, Hemoccult negative Prostate: Prostate gland firm and smooth, no enlargement, nodularity, tenderness, mass, asymmetry or induration.  Musculoskeletal: Normal  range of motion. He exhibits no edema or tenderness.  Lymphadenopathy:    He has no cervical adenopathy.  Neurological: He is alert and oriented to person, place, and time. No cranial nerve deficit. He exhibits normal muscle tone. Coordination normal.  Speech normal, gait unassisted and normal for age, motor strength appropriate for age   Skin: Skin is warm and dry. No pallor.  No jaundice  Psychiatric: He has a normal mood and affect. His behavior is normal. Judgment and thought content normal.  Vitals reviewed.        Assessment & Plan:   Problem List Items Addressed This Visit      Cardiovascular and Mediastinum   Hypertension    Patient not seen since 2011, states he's taking medications daily prescribed elsewhere. BP today okay. Recommend no change.      Relevant Medications   lisinopril-hydrochlorothiazide (PRINZIDE,ZESTORETIC) 10-12.5 MG per tablet     Other   Annual physical exam - Primary    Td 2011 Offered a flu shot Prostate cancer screening, DRE negative today, check a PSA Colon cancer screening: Never had a colonoscopy, we discussed Cscope vs iFob ; iFOB provided, will call if interested in a colonoscopy  Discussed diet and exercise Labs      Relevant Orders   Comprehensive metabolic panel (Completed)   CBC with Differential/Platelet (Completed)   TSH (Completed)   Lipid panel (Completed)   PSA (Completed)   At risk for sleep apnea    Had a positive screening for sleep apnea back in 2015, today the patient reports no snoring, he does not feel sleepy or fatigued.

## 2014-11-26 NOTE — Patient Instructions (Signed)
Get your blood work before you leave    Check the  blood pressure 2 or 3 times a month  Be sure your blood pressure is between 110/65 and  145/85.  if it is consistently higher or lower, let me know       Please come back to the office in 6 months for a check up

## 2014-11-27 LAB — COMPREHENSIVE METABOLIC PANEL
ALBUMIN: 4.3 g/dL (ref 3.5–5.2)
ALT: 52 U/L (ref 0–53)
AST: 28 U/L (ref 0–37)
Alkaline Phosphatase: 120 U/L — ABNORMAL HIGH (ref 39–117)
BUN: 18 mg/dL (ref 6–23)
CO2: 30 meq/L (ref 19–32)
Calcium: 10 mg/dL (ref 8.4–10.5)
Chloride: 104 mEq/L (ref 96–112)
Creatinine, Ser: 0.95 mg/dL (ref 0.40–1.50)
GFR: 89.02 mL/min (ref >60.00)
GLUCOSE: 92 mg/dL (ref 70–99)
POTASSIUM: 4.5 meq/L (ref 3.5–5.1)
SODIUM: 141 meq/L (ref 135–145)
Total Bilirubin: 0.4 mg/dL (ref 0.2–1.2)
Total Protein: 7.3 g/dL (ref 6.0–8.3)

## 2014-11-27 LAB — LDL CHOLESTEROL, DIRECT: Direct LDL: 182 mg/dL

## 2014-11-27 LAB — LIPID PANEL
CHOLESTEROL: 269 mg/dL — AB (ref 0–200)
HDL: 38.1 mg/dL — ABNORMAL LOW (ref >39.00)
NonHDL: 230.9
TRIGLYCERIDES: 306 mg/dL — AB (ref 0.0–149.0)
Total CHOL/HDL Ratio: 7
VLDL: 61.2 mg/dL — AB (ref 0.0–40.0)

## 2014-11-27 LAB — CBC WITH DIFFERENTIAL/PLATELET
BASOS ABS: 0.1 10*3/uL (ref 0.0–0.1)
BASOS PCT: 1.3 % (ref 0.0–3.0)
EOS ABS: 0.2 10*3/uL (ref 0.0–0.7)
EOS PCT: 1.9 % (ref 0.0–5.0)
HEMATOCRIT: 44.1 % (ref 39.0–52.0)
HEMOGLOBIN: 15.3 g/dL (ref 13.0–17.0)
LYMPHS ABS: 3.9 10*3/uL (ref 0.7–4.0)
Lymphocytes Relative: 41.9 % (ref 12.0–46.0)
MCHC: 34.8 g/dL (ref 30.0–36.0)
MCV: 90.6 fl (ref 78.0–100.0)
Monocytes Absolute: 0.7 10*3/uL (ref 0.1–1.0)
Monocytes Relative: 7.4 % (ref 3.0–12.0)
NEUTROS ABS: 4.5 10*3/uL (ref 1.4–7.7)
Neutrophils Relative %: 47.5 % (ref 43.0–77.0)
Platelets: 268 10*3/uL (ref 150.0–400.0)
RBC: 4.87 Mil/uL (ref 4.22–5.81)
RDW: 14 % (ref 11.5–15.5)
WBC: 9.4 10*3/uL (ref 4.0–10.5)

## 2014-11-27 LAB — TSH: TSH: 1.97 u[IU]/mL (ref 0.35–4.50)

## 2014-11-27 LAB — PSA: PSA: 0.33 ng/mL (ref 0.10–4.00)

## 2014-12-05 ENCOUNTER — Other Ambulatory Visit (INDEPENDENT_AMBULATORY_CARE_PROVIDER_SITE_OTHER): Payer: 59

## 2014-12-05 DIAGNOSIS — Z0000 Encounter for general adult medical examination without abnormal findings: Secondary | ICD-10-CM

## 2014-12-05 LAB — FECAL OCCULT BLOOD, IMMUNOCHEMICAL: Fecal Occult Bld: NEGATIVE

## 2015-02-16 ENCOUNTER — Ambulatory Visit: Payer: Self-pay | Admitting: Internal Medicine

## 2015-05-21 ENCOUNTER — Ambulatory Visit (INDEPENDENT_AMBULATORY_CARE_PROVIDER_SITE_OTHER): Payer: 59 | Admitting: Medical

## 2015-05-21 ENCOUNTER — Encounter: Payer: Self-pay | Admitting: Medical

## 2015-05-21 VITALS — BP 134/84 | HR 86 | Temp 98.8°F | Ht 65.0 in | Wt 195.4 lb

## 2015-05-21 DIAGNOSIS — J029 Acute pharyngitis, unspecified: Secondary | ICD-10-CM | POA: Diagnosis not present

## 2015-05-21 MED ORDER — AZITHROMYCIN 250 MG PO TABS: ORAL_TABLET | ORAL | Status: DC

## 2015-05-21 MED ORDER — FLUTICASONE PROPIONATE 50 MCG/ACT NA SUSP: 2.0000 | Freq: Every day | NASAL | Status: DC

## 2015-05-21 NOTE — Addendum Note (Signed)
Addended by: Bunnie Domino on: 05/21/2015 04:58 PM   Modules accepted: Orders

## 2015-05-21 NOTE — Progress Notes (Signed)
Subjective:    Patient ID: Jerry Sims, male    DOB: 1966-10-01, 49 y.o.   MRN: [id number]  HPI   I have reviewed pt PMH, PSH, FH, Social History and Surgical History.  Htn- hx of. Taking tab 4-5 years.   Pt denies any gasrtitis.   After asking above realized he is not new pt although placed on schedule as new pt? Pt saw Dr. Larose Kells in Feb 2016.  Pt states he was working 3 days ago. He has moderate sore throat. And also some joint pain. With  fever and chills. Sweating some as well. No skin rash.  No sob or wheezing.  This started the day he was exposed to a lot of dust and may have inhaled some dust from insulation particles. But no cough, no wheezing and no sob.    Note fever, chills and diaphoresis not present on today exam. Review of Systems  Constitutional: Positive for fever, chills and diaphoresis. Negative for fatigue.  HENT: Positive for sore throat. Negative for congestion.   Respiratory: Negative for cough, chest tightness and wheezing.   Cardiovascular: Negative for chest pain and palpitations.  Genitourinary: Negative for decreased urine volume.  Musculoskeletal: Positive for myalgias and arthralgias.  Neurological: Negative for dizziness, syncope and headaches.  Hematological: Negative for adenopathy. Does not bruise/bleed easily.   Past Medical History  Diagnosis Date  . Extensor tenosynovitis of right wrist   . Hypertension   . Wears glasses   . At risk for sleep apnea     STOP-BANG= 4     SENT TO PCP 05-29-2014  . GERD (gastroesophageal reflux disease)   . Depression     Pt denies hx of.    History   Social History  . Marital Status: Married    Spouse Name: N/A  . Number of Children: 2  . Years of Education: N/A   Occupational History  . Architect, Sales executive     Social History Main Topics  . Smoking status: Never Smoker   . Smokeless tobacco: Never Used  . Alcohol Use: Yes     Comment: OCCASIONAL  . Drug Use: No  . Sexual Activity:  Not on file   Other Topics Concern  . Not on file   Social History Narrative   Household-- pt, wife and his daughter    Original from [place]    Past Surgical History  Procedure Laterality Date  . Carpal tunnel release Right 2010  . Nasal septum surgery  2014  . Dorsal compartment release Right 06/05/2014    Procedure:  RIGHT WRIST FIRST/SECOND DORSAL COMPARTMENT TENOSYNOVECTOMY;  Surgeon: Linna Hoff, MD;  Location: [facility];  Service: Orthopedics;  Laterality: Right;    Family History  Problem Relation Age of Onset  . Colon cancer Neg Hx   . Prostate cancer Neg Hx   . CAD Neg Hx   . Diabetes Other     GM, uncle     No Known Allergies  Current Outpatient Prescriptions on File Prior to Visit  Medication Sig Dispense Refill  . lisinopril-hydrochlorothiazide (PRINZIDE,ZESTORETIC) 10-12.5 MG per tablet Take 1 tablet by mouth daily. 90 tablet 3   No current facility-administered medications on file prior to visit.    BP 134/84 mmHg  Pulse 86  Temp(Src) 98.8 F (37.1 C) (Oral)  Ht 5\' 5"  (1.651 m)  Wt 195 lb 6.4 oz (88.633 kg)  BMI 32.52 kg/m2  SpO2 99%       Objective:  Physical Exam  General  Mental Status - Alert. General Appearance - Well groomed. Not in acute distress.  Skin Rashes- No Rashes.  HEENT Head- Normal. Ear Auditory Canal - Left- Normal. Right - Normal.Tympanic Membrane- Left- Normal. Right- Normal. Eye Sclera/Conjunctiva- Left- Normal. Right- Normal. Nose & Sinuses Nasal Mucosa- Left-  Not oggy or Congested. Right-  Not  boggy or Congested. Mouth & Throat Lips: Upper Lip- Normal: no dryness, cracking, pallor, cyanosis, or vesicular eruption. Lower Lip-Normal: no dryness, cracking, pallor, cyanosis or vesicular eruption. Buccal Mucosa- Bilateral- No Aphthous ulcers. Oropharynx- No Discharge or Erythema. Tonsils: Characteristics- Bilateral- moderate bright  Erythema + Congestion. Size/Enlargement- Bilateral- 1+  enlargement. Discharge- bilateral-None.  Neck Neck- Supple. No Masses, mild submandibular nodes enlarged.   Chest and Lung Exam Auscultation: Breath Sounds:- even and unlabored  Cardiovascular Auscultation:Rythm- Regular, rate and rhythm. Murmurs & Other Heart Sounds:Ausculatation of the heart reveal- No Murmurs.  Lymphatic Head & Neck General Head & Neck Lymphatics: Bilateral: Description- No Localized lymphadenopathy.       Assessment & Plan:  Your rapid strep test was negative but your throat looks suspicious for possible strep. Your symptom of body aches and joint pain  along with st are also suspicious. Rapid test can be false negative so I a treating you with antibiotic. Your symptoms should improve. Use ibuprofen for body aches.  With you exposure to dust the other day if you get any nasal congestion then start flonase.  Any respiratory signs or symptoms notify us.  Follow up in 7 days or as

## 2015-05-21 NOTE — Progress Notes (Signed)
Pre visit review using our clinic review tool, if applicable. No additional management support is needed unless otherwise documented below in the visit note. 

## 2015-05-21 NOTE — Patient Instructions (Addendum)
Your rapid strep test was negative but your throat looks suspicious for possible strep. Your symptom of body aches and joint pain  along with st are also suspicious. Rapid test can be false negative so I a treating you with antibiotic. Your symptoms should improve. Use ibuprofen for body aches.  With you exposure to dust the other day if you get any nasal congestion then start flonase.  Any respiratory signs or symptoms notify us.  Follow up in 7 days or as needed

## 2015-07-08 ENCOUNTER — Ambulatory Visit (INDEPENDENT_AMBULATORY_CARE_PROVIDER_SITE_OTHER): Payer: 59 | Admitting: Internal Medicine

## 2015-07-08 ENCOUNTER — Encounter: Payer: Self-pay | Admitting: Internal Medicine

## 2015-07-08 VITALS — BP 118/74 | HR 63 | Temp 98.1°F | Ht 65.0 in | Wt 195.2 lb

## 2015-07-08 DIAGNOSIS — I10 Essential (primary) hypertension: Secondary | ICD-10-CM

## 2015-07-08 DIAGNOSIS — M542 Cervicalgia: Secondary | ICD-10-CM

## 2015-07-08 DIAGNOSIS — Z09 Encounter for follow-up examination after completed treatment for conditions other than malignant neoplasm: Secondary | ICD-10-CM

## 2015-07-08 MED ORDER — CYCLOBENZAPRINE HCL 10 MG PO TABS: 10.0000 mg | ORAL_TABLET | Freq: Every evening | ORAL | Status: DC | PRN

## 2015-07-08 MED ORDER — PREDNISONE 10 MG PO TABS: ORAL_TABLET | ORAL | Status: DC

## 2015-07-08 NOTE — Patient Instructions (Signed)
  Please schedule labs to be done within few days (fasting)   For the pain: Prednisone for a few days as prescribed Flexeril -a muscle relaxant - to take at night as needed, will cause drowsiness Tylenol OTC as needed Warm compress on the right side of the neck at nighttime. If not improving in the next 2 weeks or if worse :  please call the office   Next visit  for a physical exam by February 2017, fasting    Please schedule an appointment at the front desk

## 2015-07-08 NOTE — Progress Notes (Signed)
Subjective:    Patient ID: Jerry Sims, male    DOB: 1965/11/22, 49 y.o.   MRN: [id number]  DOS:  07/08/2015 Type of visit - description : Acute Interval history: 2 weeks history of right-sided, posterior neck pain described as a pulling, "not really pain". Denies any radiation, not upper or lower ext  paresthesias Gait is normal. Denies any injury. Also, has a history of high cholesterol, he does not exercise per se but is very active. Eats relatively healthy.   Review of Systems   Past Medical History  Diagnosis Date  . Extensor tenosynovitis of right wrist   . Hypertension   . Wears glasses   . At risk for sleep apnea     STOP-BANG= 4     SENT TO PCP 05-29-2014  . GERD (gastroesophageal reflux disease)   . Depression     Pt denies hx of.    Past Surgical History  Procedure Laterality Date  . Carpal tunnel release Right 2010  . Nasal septum surgery  2014  . Dorsal compartment release Right 06/05/2014    Procedure:  RIGHT WRIST FIRST/SECOND DORSAL COMPARTMENT TENOSYNOVECTOMY;  Surgeon: Linna Hoff, MD;  Location: [facility];  Service: Orthopedics;  Laterality: Right;    Social History   Social History  . Marital Status: Married    Spouse Name: N/A  . Number of Children: 2  . Years of Education: N/A   Occupational History  . Architect, Sales executive     Social History Main Topics  . Smoking status: Never Smoker   . Smokeless tobacco: Never Used  . Alcohol Use: Yes     Comment: OCCASIONAL  . Drug Use: No  . Sexual Activity: Not on file   Other Topics Concern  . Not on file   Social History Narrative   Household-- pt, wife and his daughter    Original from [place]        Medication List       This list is accurate as of: 07/08/15 11:59 PM.  Always use your most recent med list.               cyclobenzaprine 10 MG tablet  Commonly known as:  FLEXERIL  Take 1 tablet (10 mg total) by mouth at bedtime as needed for muscle  spasms.     fluticasone 50 MCG/ACT nasal spray  Commonly known as:  FLONASE  Place 2 sprays into both nostrils daily.     lisinopril-hydrochlorothiazide 10-12.5 MG per tablet  Commonly known as:  PRINZIDE,ZESTORETIC  Take 1 tablet by mouth daily.     predniSONE 10 MG tablet  Commonly known as:  DELTASONE  4 tablets x 2 days, 3 tabs x 2 days, 2 tabs x 2 days, 1 tab x 2 days           Objective:   Physical Exam BP 118/74 mmHg  Pulse 63  Temp(Src) 98.1 F (36.7 C) (Oral)  Ht 5\' 5"  (1.651 m)  Wt 195 lb 4 oz (88.565 kg)  BMI 32.49 kg/m2  SpO2 97% General:   Well developed, well nourished . NAD.  HEENT:  Normocephalic . Face symmetric, atraumatic Neck: No TTP, range of motion is appropriate. Did report some pain with moving the neck. Skin: Not pale. Not jaundice Neurologic:  alert & oriented X3.  Speech normal, gait appropriate for age and unassisted DTRs and motor strength symmetric Psych--  Cognition and judgment appear intact.  Cooperative with normal  attention span and concentration.  Behavior appropriate. No anxious or depressed appearing.      Assessment & Plan:   Assessment >  HTN Dyslipidemia LDL 182 11-2014, declined  medications GERD At risk of OSA per stop bang 05-2014 : denied fatigue, snoring to me  Plan  Neck pain: sx for 2 weeks, benign on clinical grounds, neuro exam normal. Will prescribe prednisone, Flexeril, Tylenol. If not better , he will let me know Dyslipidemia: Recheck labs next week. Primary care: Patient to take prednisone starting today consequently recommend to have a flu shot next week.

## 2015-07-08 NOTE — Progress Notes (Signed)
Pre visit review using our clinic review tool, if applicable. No additional management support is needed unless otherwise documented below in the visit note. 

## 2015-07-09 DIAGNOSIS — Z09 Encounter for follow-up examination after completed treatment for conditions other than malignant neoplasm: Secondary | ICD-10-CM | POA: Insufficient documentation

## 2015-07-09 NOTE — Assessment & Plan Note (Signed)
Neck pain: sx for 2 weeks, benign on clinical grounds, neuro exam normal. Will prescribe prednisone, Flexeril, Tylenol. If not better , he will let me know Dyslipidemia: Recheck labs next week. Primary care: Patient to take prednisone starting today consequently recommend to have a flu shot next week.

## 2015-07-13 ENCOUNTER — Other Ambulatory Visit (INDEPENDENT_AMBULATORY_CARE_PROVIDER_SITE_OTHER): Payer: 59

## 2015-07-13 DIAGNOSIS — I10 Essential (primary) hypertension: Secondary | ICD-10-CM | POA: Diagnosis not present

## 2015-07-13 LAB — LIPID PANEL
CHOLESTEROL: 269 mg/dL — AB (ref 0–200)
HDL: 47.8 mg/dL (ref >39.00)
LDL Cholesterol: 183 mg/dL — ABNORMAL HIGH (ref 0–99)
NONHDL: 221.27
Total CHOL/HDL Ratio: 6
Triglycerides: 191 mg/dL — ABNORMAL HIGH (ref 0.0–149.0)
VLDL: 38.2 mg/dL (ref 0.0–40.0)

## 2015-07-14 ENCOUNTER — Telehealth: Payer: Self-pay | Admitting: Internal Medicine

## 2015-07-14 LAB — HIV ANTIBODY (ROUTINE TESTING W REFLEX): HIV 1&2 Ab, 4th Generation: NONREACTIVE

## 2015-07-14 LAB — HEPATITIS C ANTIBODY: HCV Ab: NEGATIVE

## 2015-07-14 NOTE — Telephone Encounter (Signed)
Did you call Pt regarding his results?

## 2015-07-14 NOTE — Telephone Encounter (Signed)
Returning call. Best # [phone].

## 2015-07-15 MED ORDER — ATORVASTATIN CALCIUM 10 MG PO TABS: 10.0000 mg | ORAL_TABLET | Freq: Every day | ORAL | Status: DC

## 2015-07-15 NOTE — Telephone Encounter (Signed)
Left detailed message: Cholesterol is elevated, recommend medication, will send a prescription for Lipitor 10 mg one by mouth daily at bedtime #30 and 3 refills. Recommend to come back in 2 months for blood work only, if he has any side effects such as aches or pains he needs to let me know.

## 2015-07-21 ENCOUNTER — Telehealth: Payer: Self-pay | Admitting: Internal Medicine

## 2015-07-21 NOTE — Telephone Encounter (Signed)
Pt wife called stating that pt has had a horrible headache since taking atorvastatin. She is concerned. Ok to call pt cell # but only if spanish speaking. If english, please call her Jerry Sims) at [phone].

## 2015-07-21 NOTE — Telephone Encounter (Signed)
Please advise 

## 2015-07-21 NOTE — Telephone Encounter (Signed)
Wife reports patient started with heartburn and a headache since taking Lipitor the last 4 days. Plan:  Stop Lipitor for 10 days, then restart. If heartburn -HA actually resurface they will let me know ---> change to Pravachol? If heartburn or headache persist or severe: Needs to be seen She verbalized understanding.

## 2015-09-14 ENCOUNTER — Telehealth: Payer: Self-pay | Admitting: Internal Medicine

## 2015-09-15 NOTE — Telephone Encounter (Signed)
Mailed letter to pt

## 2015-09-15 NOTE — Telephone Encounter (Signed)
Called the patient to the 2 numbers listed, liked to see if he is taking Lipitor or not. Unable to contact the patient or leave a message.  Send a letter Cassandria [place], please come back to the office at your earliest convenience, call and make an appointment, we need to talk about your cholesterol. por favor llame para hacer una cita, debemos hablar de su colesterol.

## 2015-10-20 ENCOUNTER — Encounter: Payer: Self-pay | Admitting: Internal Medicine

## 2015-10-20 ENCOUNTER — Ambulatory Visit (INDEPENDENT_AMBULATORY_CARE_PROVIDER_SITE_OTHER): Payer: 59 | Admitting: Internal Medicine

## 2015-10-20 VITALS — BP 116/76 | HR 58 | Temp 98.4°F | Ht 65.0 in | Wt 201.4 lb

## 2015-10-20 DIAGNOSIS — E785 Hyperlipidemia, unspecified: Secondary | ICD-10-CM

## 2015-10-20 DIAGNOSIS — I10 Essential (primary) hypertension: Secondary | ICD-10-CM

## 2015-10-20 MED ORDER — PRAVASTATIN SODIUM 20 MG PO TABS: 20.0000 mg | ORAL_TABLET | Freq: Every day | ORAL | Status: DC

## 2015-10-20 NOTE — Progress Notes (Signed)
Pre visit review using our clinic review tool, if applicable. No additional management support is needed unless otherwise documented below in the visit note. 

## 2015-10-20 NOTE — Patient Instructions (Signed)
BEFORE YOU LEAVE THE OFFICE:  GO TO THE FRONT DESK Schedule labs to be done in 1 month (FLP: hyperlipidemia)  Schedule a routine office visit or check up to be done in  8 months  No  fasting   Front desk:  [place] [address]  [address]

## 2015-10-20 NOTE — Progress Notes (Signed)
Subjective:    Patient ID: Jerry Sims, male    DOB: 1966/04/06, 50 y.o.   MRN: [id number]  DOS:  10/20/2015 Type of visit - description : Routine checkup Interval history: Cholesterol: Patient tried atorvastatin and developed severe heartburn and a headache. He try again and symptoms returned. Currently asymptomatic, not taking any statin.  Review of Systems  Denies chest pain or difficulty breathing. No nausea, vomiting, diarrhea  Past Medical History  Diagnosis Date  . Extensor tenosynovitis of right wrist   . Hypertension   . Wears glasses   . At risk for sleep apnea     STOP-BANG= 4     SENT TO PCP 05-29-2014  . GERD (gastroesophageal reflux disease)   . Depression     Pt denies hx of.    Past Surgical History  Procedure Laterality Date  . Carpal tunnel release Right 2010  . Nasal septum surgery  2014  . Dorsal compartment release Right 06/05/2014    Procedure:  RIGHT WRIST FIRST/SECOND DORSAL COMPARTMENT TENOSYNOVECTOMY;  Surgeon: Linna Hoff, MD;  Location: [facility];  Service: Orthopedics;  Laterality: Right;    Social History   Social History  . Marital Status: Married    Spouse Name: N/A  . Number of Children: 2  . Years of Education: N/A   Occupational History  . Architect, Sales executive     Social History Main Topics  . Smoking status: Never Smoker   . Smokeless tobacco: Never Used  . Alcohol Use: Yes     Comment: OCCASIONAL  . Drug Use: No  . Sexual Activity: Not on file   Other Topics Concern  . Not on file   Social History Narrative   Household-- pt, wife and his daughter    Original from [place]        Medication List       This list is accurate as of: 10/20/15  6:19 PM.  Always use your most recent med list.               cyclobenzaprine 10 MG tablet  Commonly known as:  FLEXERIL  Take 1 tablet (10 mg total) by mouth at bedtime as needed for muscle spasms.     fluticasone 50 MCG/ACT nasal spray  Commonly  known as:  FLONASE  Place 2 sprays into both nostrils daily.     lisinopril-hydrochlorothiazide 10-12.5 MG tablet  Commonly known as:  PRINZIDE,ZESTORETIC  Take 1 tablet by mouth daily.     pravastatin 20 MG tablet  Commonly known as:  PRAVACHOL  Take 1 tablet (20 mg total) by mouth daily.           Objective:   Physical Exam BP 116/76 mmHg  Pulse 58  Temp(Src) 98.4 F (36.9 C) (Oral)  Ht 5\' 5"  (1.651 m)  Wt 201 lb 6 oz (91.343 kg)  BMI 33.51 kg/m2  SpO2 98% General:   Well developed, well nourished . NAD.  HEENT:  Normocephalic . Face symmetric, atraumatic Skin: Not pale. Not jaundice Neurologic:  alert & oriented X3.  Speech normal, gait appropriate for age and unassisted Psych--  Cognition and judgment appear intact.  Cooperative with normal attention span and concentration.  Behavior appropriate. No anxious or depressed appearing.      Assessment & Plan:   Assessment >  HTN Dyslipidemia LDL 182 ----> 11-2014, intolerant to Lipitor 06-2015  GERD At risk of OSA per stop bang 05-2014 : denied fatigue, snoring to  me  PLAN Dyslipidemia: Risk of strokes- CAD due to high cholesterol discussed with the patient. We also discussed diet and exercise. He is intolerant to Lipitor, agreed to try Pravachol. Check labs  in one month HTN: Well-controlled, CMP and one month Primary care  declined a flu shot Follow-up 8 months. All instructions discuss in Spanish  Today, I spent more than   15 min with the patient: >50% of the time counseling regards risks of high cholesterol, diet and exercise

## 2015-10-21 NOTE — Addendum Note (Signed)
Addended byDamita Dunnings D on: 10/21/2015 07:49 AM   Modules accepted: Orders

## 2015-11-20 ENCOUNTER — Other Ambulatory Visit: Payer: 59

## 2015-12-15 ENCOUNTER — Telehealth: Payer: Self-pay

## 2015-12-15 NOTE — Telephone Encounter (Signed)
Letter printed and mailed to Pt.  

## 2015-12-15 NOTE — Telephone Encounter (Signed)
-----   Message from Colon Branch, MD sent at 12/14/2015  5:43 PM EST ----- Regarding: send a letter (ALWAYS PUT "REASON" FOR LETTERS) [place], is time to check your cholesterol to see if the medication called Pravachol is working. Please call the office and make an appointment for labs only. Misty , LE TOCA SACARSE UNA MUESTRA DE SANGRE PARA ESTAR SEGUROS QUE LA PASTILLA DE COLESTEROL ESTA FUNCIONANDO. POR FAVOR LLAME A LA OFICINA Y SAQUE UNA CITA , SOLO NECESITA IR [place], NO NECESITA VERME .

## 2016-01-06 ENCOUNTER — Other Ambulatory Visit (INDEPENDENT_AMBULATORY_CARE_PROVIDER_SITE_OTHER): Payer: 59

## 2016-01-06 DIAGNOSIS — R739 Hyperglycemia, unspecified: Secondary | ICD-10-CM

## 2016-01-06 DIAGNOSIS — E785 Hyperlipidemia, unspecified: Secondary | ICD-10-CM | POA: Diagnosis not present

## 2016-01-06 LAB — COMPREHENSIVE METABOLIC PANEL
ALT: 27 U/L (ref 0–53)
AST: 19 U/L (ref 0–37)
Albumin: 4 g/dL (ref 3.5–5.2)
Alkaline Phosphatase: 95 U/L (ref 39–117)
BILIRUBIN TOTAL: 0.3 mg/dL (ref 0.2–1.2)
BUN: 20 mg/dL (ref 6–23)
CO2: 32 meq/L (ref 19–32)
Calcium: 9.4 mg/dL (ref 8.4–10.5)
Chloride: 106 mEq/L (ref 96–112)
Creatinine, Ser: 1.05 mg/dL (ref 0.40–1.50)
GFR: 78.97 mL/min (ref >60.00)
Glucose, Bld: 121 mg/dL — ABNORMAL HIGH (ref 70–99)
Potassium: 4.4 mEq/L (ref 3.5–5.1)
Sodium: 141 mEq/L (ref 135–145)
Total Protein: 7.3 g/dL (ref 6.0–8.3)

## 2016-01-06 LAB — LIPID PANEL
CHOL/HDL RATIO: 7
CHOLESTEROL: 236 mg/dL — AB (ref 0–200)
HDL: 35.7 mg/dL — AB (ref >39.00)
LDL Cholesterol: 163 mg/dL — ABNORMAL HIGH (ref 0–99)
NonHDL: 199.86
Triglycerides: 186 mg/dL — ABNORMAL HIGH (ref 0.0–149.0)
VLDL: 37.2 mg/dL (ref 0.0–40.0)

## 2016-01-07 ENCOUNTER — Other Ambulatory Visit: Payer: Self-pay | Admitting: Internal Medicine

## 2016-01-08 MED ORDER — PRAVASTATIN SODIUM 40 MG PO TABS: 40.0000 mg | ORAL_TABLET | Freq: Every day | ORAL | Status: DC

## 2016-01-08 NOTE — Addendum Note (Signed)
Addended by: Damita Dunnings D on: 01/08/2016 10:46 AM   Modules accepted: Orders, Medications

## 2016-09-26 ENCOUNTER — Other Ambulatory Visit: Payer: Self-pay | Admitting: Internal Medicine

## 2016-10-24 ENCOUNTER — Other Ambulatory Visit: Payer: Self-pay | Admitting: Internal Medicine

## 2016-11-28 ENCOUNTER — Other Ambulatory Visit: Payer: Self-pay | Admitting: Internal Medicine

## 2017-01-04 ENCOUNTER — Telehealth: Payer: Self-pay | Admitting: Internal Medicine

## 2017-01-04 NOTE — Telephone Encounter (Signed)
Patient Name: Jerry Sims  DOB: 12-13-1965    Initial Comment Caller states, she is calling for an appointment - sore throat, swallowing difficulty, and fever 101.    Nurse Assessment  Nurse: Raphael Gibney, RN, Vanita Ingles Date/Time (Eastern Time): 01/04/2017 10:53:52 AM  Confirm and document reason for call. If symptomatic, describe symptoms. ---Caller states spouse has sore throat. Has some difficulty swallowing due to pain. Temp 101. Has had symptoms 2-3 days.  Does the patient have any new or worsening symptoms? ---Yes  Will a triage be completed? ---Yes  Related visit to physician within the last 2 weeks? ---No  Does the PT have any chronic conditions? (i.e. diabetes, asthma, etc.) ---Yes  List chronic conditions. ---HTN  Is this a behavioral health or substance abuse call? ---No     Guidelines    Guideline Title Affirmed Question Affirmed Notes  Sore Throat SEVERE (e.g., excruciating) throat pain    Final Disposition User   See Physician within 24 Hours [place], RN, [place]    Comments  appt scheduled for 01/05/17 at 9:30 am with Dr. Kathlene November   Referrals  REFERRED TO PCP OFFICE   Disagree/Comply: Comply

## 2017-01-04 NOTE — Telephone Encounter (Signed)
FYI

## 2017-01-04 NOTE — Telephone Encounter (Signed)
noted 

## 2017-01-05 ENCOUNTER — Encounter: Payer: Self-pay | Admitting: Internal Medicine

## 2017-01-05 ENCOUNTER — Ambulatory Visit (INDEPENDENT_AMBULATORY_CARE_PROVIDER_SITE_OTHER): Payer: 59 | Admitting: Internal Medicine

## 2017-01-05 VITALS — BP 124/68 | HR 74 | Temp 98.4°F | Resp 14 | Ht 65.0 in | Wt 201.0 lb

## 2017-01-05 DIAGNOSIS — R739 Hyperglycemia, unspecified: Secondary | ICD-10-CM | POA: Diagnosis not present

## 2017-01-05 DIAGNOSIS — J029 Acute pharyngitis, unspecified: Secondary | ICD-10-CM | POA: Diagnosis not present

## 2017-01-05 DIAGNOSIS — I10 Essential (primary) hypertension: Secondary | ICD-10-CM

## 2017-01-05 DIAGNOSIS — E785 Hyperlipidemia, unspecified: Secondary | ICD-10-CM

## 2017-01-05 LAB — BASIC METABOLIC PANEL
BUN: 15 mg/dL (ref 6–23)
CO2: 31 mEq/L (ref 19–32)
CREATININE: 0.99 mg/dL (ref 0.40–1.50)
Calcium: 10.2 mg/dL (ref 8.4–10.5)
Chloride: 102 mEq/L (ref 96–112)
GFR: 84.19 mL/min (ref >60.00)
Glucose, Bld: 104 mg/dL — ABNORMAL HIGH (ref 70–99)
POTASSIUM: 4.4 meq/L (ref 3.5–5.1)
Sodium: 138 mEq/L (ref 135–145)

## 2017-01-05 LAB — POCT RAPID STREP A (OFFICE): RAPID STREP A SCREEN: NEGATIVE

## 2017-01-05 LAB — POCT INFLUENZA A/B
INFLUENZA B, POC: NEGATIVE
Influenza A, POC: NEGATIVE

## 2017-01-05 LAB — HEMOGLOBIN A1C: HEMOGLOBIN A1C: 6.3 % (ref 4.6–6.5)

## 2017-01-05 MED ORDER — AMOXICILLIN 875 MG PO TABS: 875.0000 mg | ORAL_TABLET | Freq: Two times a day (BID) | ORAL | 0 refills | Status: DC

## 2017-01-05 NOTE — Progress Notes (Signed)
Pre visit review using our clinic review tool, if applicable. No additional management support is needed unless otherwise documented below in the visit note. 

## 2017-01-05 NOTE — Patient Instructions (Signed)
Get your blood work before you leave  Schedule a physical exam 3 months from now  Stop Pravachol  Start taking the antibiotic: Amoxicillin  Rest, fluids , tylenol  If cough:  Take Mucinex DM twice a day as needed until better  If nasal congestion: Use OTC   Flonase : 2 nasal sprays on each side of the nose in the morning until you feel better   Call if not gradually better over the next  10 days  Call anytime if the symptoms are severe

## 2017-01-05 NOTE — Progress Notes (Signed)
Subjective:    Patient ID: Jerry Sims, male    DOB: 1966/01/19, 51 y.o.   MRN: [id number]  DOS:  01/05/2017 Type of visit - description : acute Interval history:  Symptoms started 2 or 3 days ago: Itchy throat, some pain, "phlegm accumulation" in the throat. He felt subjectively hot and check his temperature and it was a little high, could not tell me the readings. + Chills, + joint aches  Has not been seen in over a year so we talk about other issues HTN: Reports he takes medications regularly, BP today is very good High cholesterol: takes Pravachol "sometimes". Is trying to work on his diet. Hyperglycemia: a A1c was rx but  never drawn.  Review of Systems  He actually denies any cough sinus congestion or nasal discharge No chest congestion No nausea, vomiting. No headache.  Past Medical History:  Diagnosis Date  . At risk for sleep apnea    STOP-BANG= 4     SENT TO PCP 05-29-2014  . Depression    Pt denies hx of.  . Extensor tenosynovitis of right wrist   . GERD (gastroesophageal reflux disease)   . Hypertension   . Wears glasses     Past Surgical History:  Procedure Laterality Date  . CARPAL TUNNEL RELEASE Right 2010  . DORSAL COMPARTMENT RELEASE Right 06/05/2014   Procedure:  RIGHT WRIST FIRST/SECOND DORSAL COMPARTMENT TENOSYNOVECTOMY;  Surgeon: Linna Hoff, MD;  Location: [facility];  Service: Orthopedics;  Laterality: Right;  . NASAL SEPTUM SURGERY  2014    Social History   Social History  . Marital status: Married    Spouse name: N/A  . Number of children: 2  . Years of education: N/A   Occupational History  . Architect, Sales executive     Social History Main Topics  . Smoking status: Never Smoker  . Smokeless tobacco: Never Used  . Alcohol use Yes     Comment: OCCASIONAL  . Drug use: No  . Sexual activity: Not on file   Other Topics Concern  . Not on file   Social History Narrative   Household-- pt, wife and his daughter      Original from [place]      Allergies as of 01/05/2017      Reactions   Lipitor [atorvastatin] Other (See Comments)   Heartburn and HAs      Medication List       Accurate as of 01/05/17  1:54 PM. Always use your most recent med list.          amoxicillin 875 MG tablet Commonly known as:  AMOXIL Take 1 tablet (875 mg total) by mouth 2 (two) times daily.   fluticasone 50 MCG/ACT nasal spray Commonly known as:  FLONASE Place 2 sprays into both nostrils daily.   lisinopril-hydrochlorothiazide 10-12.5 MG tablet Commonly known as:  PRINZIDE,ZESTORETIC Take 1 tablet by mouth daily.          Objective:   Physical Exam BP 124/68 (BP Location: Left Arm, Patient Position: Sitting, Cuff Size: Normal)   Pulse 74   Temp 98.4 F (36.9 C) (Oral)   Resp 14   Ht 5\' 5"  (1.651 m)   Wt 201 lb (91.2 kg)   SpO2 95%   BMI 33.45 kg/m  General:   Well developed, well nourished . NAD.  HEENT:  Normocephalic . Face symmetric, atraumatic TMs: Slightly bulge, no red.  Throat: Mild to moderately red, uvula midline, no white  patches. Neck: No LAD is. No thyromegaly. Lungs:  CTA B Normal respiratory effort, no intercostal retractions, no accessory muscle use. Heart: RRR,  no murmur.  No pretibial edema bilaterally  Skin: Not pale. Not jaundice Neurologic:  alert & oriented X3.  Speech normal, gait appropriate for age and unassisted Psych--  Cognition and judgment appear intact.  Cooperative with normal attention span and concentration.  Behavior appropriate. No anxious or depressed appearing.      Assessment & Plan:  Assessment >  HTN Dyslipidemia LDL 182 ----> 11-2014, intolerant to Lipitor 06-2015  GERD At risk of OSA per stop bang 05-2014 : denied fatigue, snoring to me  PLAN Sore throat, subjective fever: Flu test and rapid a strep test negative. Will check strep culture, start empiric amoxicillin and conservative treatment. HTN: Reports good compliance with Zestoretic.  Check a BMP Hyperlipidemia: Taking Pravachol "sometimes", trying to work on his diet. Rec  to stop Pravachol, come back in 3 months for a physical, will get a new cholesterol baseline. Hyperglycemia: Was recommended a A1c but never proceeded. Check A1c today. RTC 3 months, fasting, CPX

## 2017-01-05 NOTE — Assessment & Plan Note (Signed)
Sore throat, subjective fever: Flu test and rapid a strep test negative. Will check strep culture, start empiric amoxicillin and conservative treatment. HTN: Reports good compliance with Zestoretic. Check a BMP Hyperlipidemia: Taking Pravachol "sometimes", trying to work on his diet. Rec  to stop Pravachol, come back in 3 months for a physical, will get a new cholesterol baseline. Hyperglycemia: Was recommended a A1c but never proceeded. Check A1c today. RTC 3 months, fasting, CPX

## 2017-01-06 LAB — CULTURE, GROUP A STREP: Organism ID, Bacteria: NORMAL

## 2017-02-15 ENCOUNTER — Other Ambulatory Visit: Payer: Self-pay | Admitting: Internal Medicine

## 2017-05-09 DIAGNOSIS — M1711 Unilateral primary osteoarthritis, right knee: Secondary | ICD-10-CM | POA: Diagnosis not present

## 2017-05-17 ENCOUNTER — Other Ambulatory Visit: Payer: Self-pay | Admitting: Internal Medicine

## 2017-07-07 ENCOUNTER — Other Ambulatory Visit: Payer: Self-pay | Admitting: Internal Medicine

## 2017-10-19 DIAGNOSIS — M25511 Pain in right shoulder: Secondary | ICD-10-CM | POA: Diagnosis not present

## 2017-10-25 DIAGNOSIS — M25511 Pain in right shoulder: Secondary | ICD-10-CM | POA: Diagnosis not present

## 2017-10-31 DIAGNOSIS — M75121 Complete rotator cuff tear or rupture of right shoulder, not specified as traumatic: Secondary | ICD-10-CM | POA: Diagnosis not present

## 2018-05-17 ENCOUNTER — Ambulatory Visit (INDEPENDENT_AMBULATORY_CARE_PROVIDER_SITE_OTHER): Payer: 59 | Admitting: Internal Medicine

## 2018-05-17 ENCOUNTER — Encounter: Payer: Self-pay | Admitting: Internal Medicine

## 2018-05-17 VITALS — BP 136/88 | HR 81 | Temp 98.1°F | Resp 16 | Ht 65.0 in | Wt 199.5 lb

## 2018-05-17 DIAGNOSIS — Z1211 Encounter for screening for malignant neoplasm of colon: Secondary | ICD-10-CM

## 2018-05-17 DIAGNOSIS — Z0000 Encounter for general adult medical examination without abnormal findings: Secondary | ICD-10-CM

## 2018-05-17 MED ORDER — LISINOPRIL-HYDROCHLOROTHIAZIDE 10-12.5 MG PO TABS: 1.0000 | ORAL_TABLET | Freq: Every day | ORAL | 1 refills | Status: DC

## 2018-05-17 NOTE — Assessment & Plan Note (Addendum)
--  Td 2011 --Prostate cancer screening : declined DRE, check a PSA --CCS Never had a colonoscopy, (-)  iFOB 2016, options discussed, elected colonoscopy, GI referral sent --Discussed diet and exercise Labs: Will come back fasting in 2 to 3 weeks: CMP, CBC, FLP, TSH, A1c, PSA

## 2018-05-17 NOTE — Progress Notes (Signed)
Subjective:    Patient ID: Jerry Sims, male    DOB: Feb 25, 1966, 52 y.o.   MRN: [id number]  DOS:  05/17/2018 Type of visit - description : cpx Interval history: Since the last office visit he is feeling well.  Run out of his medications.   Review of Systems  Reports lump at the left lower abdomen for years, not bothering him.  he is not very concerned but like you to share that information with me.  Other than above, a 14 point review of systems is negative      Past Medical History:  Diagnosis Date  . At risk for sleep apnea    STOP-BANG= 4     SENT TO PCP 05-29-2014  . Extensor tenosynovitis of right wrist   . GERD (gastroesophageal reflux disease)   . Hypertension   . Wears glasses     Past Surgical History:  Procedure Laterality Date  . CARPAL TUNNEL RELEASE Right 2010  . DORSAL COMPARTMENT RELEASE Right 06/05/2014   Procedure:  RIGHT WRIST FIRST/SECOND DORSAL COMPARTMENT TENOSYNOVECTOMY;  Surgeon: Linna Hoff, MD;  Location: [facility];  Service: Orthopedics;  Laterality: Right;  . NASAL SEPTUM SURGERY  2014    Social History   Socioeconomic History  . Marital status: Married    Spouse name: Not on file  . Number of children: 2  . Years of education: Not on file  . Highest education level: Not on file  Occupational History  . Occupation: Architect, Sales executive   Social Needs  . Financial resource strain: Not on file  . Food insecurity:    Worry: Not on file    Inability: Not on file  . Transportation needs:    Medical: Not on file    Non-medical: Not on file  Tobacco Use  . Smoking status: Never Smoker  . Smokeless tobacco: Never Used  Substance and Sexual Activity  . Alcohol use: Yes    Comment: OCCASIONAL  . Drug use: No  . Sexual activity: Not on file  Lifestyle  . Physical activity:    Days per week: Not on file    Minutes per session: Not on file  . Stress: Not on file  Relationships  . Social connections:    Talks  on phone: Not on file    Gets together: Not on file    Attends religious service: Not on file    Active member of club or organization: Not on file    Attends meetings of clubs or organizations: Not on file    Relationship status: Not on file  . Intimate partner violence:    Fear of current or ex partner: Not on file    Emotionally abused: Not on file    Physically abused: Not on file    Forced sexual activity: Not on file  Other Topics Concern  . Not on file  Social History Narrative   Household-- pt, wife and his daughter    Original from [place]     Family History  Problem Relation Age of Onset  . Diabetes Other        GM, uncle   . Colon cancer Neg Hx   . Prostate cancer Neg Hx   . CAD Neg Hx      Allergies as of 05/17/2018      Reactions   Lipitor [atorvastatin] Other (See Comments)   Heartburn and HAs      Medication List  Accurate as of 05/17/18 11:59 PM. Always use your most recent med list.          lisinopril-hydrochlorothiazide 10-12.5 MG tablet Commonly known as:  PRINZIDE,ZESTORETIC Take 1 tablet by mouth daily.          Objective:   Physical Exam  Abdominal:     BP 136/88 (BP Location: Left Arm, Patient Position: Sitting, Cuff Size: Small)   Pulse 81   Temp 98.1 F (36.7 C) (Oral)   Resp 16   Ht 5\' 5"  (1.651 m)   Wt 199 lb 8 oz (90.5 kg)   SpO2 97%   BMI 33.20 kg/m  General: Well developed, NAD, see BMI.  Neck: No  thyromegaly  HEENT:  Normocephalic . Face symmetric, atraumatic Lungs:  CTA B Normal respiratory effort, no intercostal retractions, no accessory muscle use. Heart: RRR,  no murmur.  No pretibial edema bilaterally  Abdomen:  Not distended, soft, non-tender. No rebound or rigidity.   Skin: Exposed areas without rash. Not pale. Not jaundice DRE: declined Neurologic:  alert & oriented X3.  Speech normal, gait appropriate for age and unassisted Strength symmetric and appropriate for age.  Psych: Cognition and  judgment appear intact.  Cooperative with normal attention span and concentration.  Behavior appropriate. No anxious or depressed appearing.     Assessment & Plan:   Assessment    Hyperglycemia, A1c 6.3 2018  HTN Dyslipidemia LDL 182 ----> 11-2014, intolerant to Lipitor 06-2015  GERD At risk of OSA per stop bang 05-2014 : denied fatigue, snoring to me  PLAN Hyperglycemia: Labs HTN: Ran out of medication few months ago, BP today 136/88, ambulatory BPs 120, 130.  We talk about possibly not restart his medication but he prefers to go ahead and get this Zestoretic refilled. Plan: FR meds, labs in 2 to 3 weeks, monitor BPs. Dyslipidemia: Not taking any medications, labs in 2 to 3 weeks At risk of sleep apnea: Today reports no fatigue, occasional snoring, he eventually said "I feel now w/ more energy than when I was younger" Mass, abdominal wall: Suspect a sebaceous cyst, recommend observation.  He agrees.  Will let me know if something changes RTC 6 months

## 2018-05-17 NOTE — Progress Notes (Signed)
Pre visit review using our clinic review tool, if applicable. No additional management support is needed unless otherwise documented below in the visit note. 

## 2018-05-17 NOTE — Patient Instructions (Signed)
  GO TO THE FRONT DESK --Schedule labs to be done to 3 weeks from today, fasting --Schedule your next appointment for a checkup in 6 months  Go back on your blood pressure medication, take it daily.  Check the  blood pressure 2 or 3 times a week Be sure your blood pressure is between 110/65 and  135/85. If it is consistently higher or lower, let me know

## 2018-05-19 NOTE — Assessment & Plan Note (Signed)
Hyperglycemia: Labs HTN: Ran out of medication few months ago, BP today 136/88, ambulatory BPs 120, 130.  We talk about possibly not restart his medication but he prefers to go ahead and get this Zestoretic refilled. Plan: FR meds, labs in 2 to 3 weeks, monitor BPs. Dyslipidemia: Not taking any medications, labs in 2 to 3 weeks At risk of sleep apnea: Today reports no fatigue, occasional snoring, he eventually said "I feel now w/ more energy than when I was younger" Mass, abdominal wall: Suspect a sebaceous cyst, recommend observation.  He agrees.  Will let me know if something changes RTC 6 months

## 2018-06-07 ENCOUNTER — Other Ambulatory Visit: Payer: 59

## 2018-06-08 ENCOUNTER — Other Ambulatory Visit (INDEPENDENT_AMBULATORY_CARE_PROVIDER_SITE_OTHER): Payer: 59

## 2018-06-08 DIAGNOSIS — Z0000 Encounter for general adult medical examination without abnormal findings: Secondary | ICD-10-CM | POA: Diagnosis not present

## 2018-06-08 LAB — CBC WITH DIFFERENTIAL/PLATELET
Basophils Absolute: 0 10*3/uL (ref 0.0–0.1)
Basophils Relative: 0.6 % (ref 0.0–3.0)
EOS ABS: 0.2 10*3/uL (ref 0.0–0.7)
EOS PCT: 2.8 % (ref 0.0–5.0)
HCT: 43.5 % (ref 39.0–52.0)
Hemoglobin: 14.8 g/dL (ref 13.0–17.0)
LYMPHS ABS: 3.5 10*3/uL (ref 0.7–4.0)
Lymphocytes Relative: 42.1 % (ref 12.0–46.0)
MCHC: 34 g/dL (ref 30.0–36.0)
MCV: 92.3 fl (ref 78.0–100.0)
MONO ABS: 0.6 10*3/uL (ref 0.1–1.0)
Monocytes Relative: 6.9 % (ref 3.0–12.0)
NEUTROS PCT: 47.6 % (ref 43.0–77.0)
Neutro Abs: 4 10*3/uL (ref 1.4–7.7)
Platelets: 261 10*3/uL (ref 150.0–400.0)
RBC: 4.72 Mil/uL (ref 4.22–5.81)
RDW: 13.7 % (ref 11.5–15.5)
WBC: 8.3 10*3/uL (ref 4.0–10.5)

## 2018-06-08 LAB — LIPID PANEL
Cholesterol: 249 mg/dL — ABNORMAL HIGH (ref 0–200)
HDL: 34.6 mg/dL — ABNORMAL LOW (ref >39.00)
NonHDL: 214.19
TRIGLYCERIDES: 317 mg/dL — AB (ref 0.0–149.0)
Total CHOL/HDL Ratio: 7
VLDL: 63.4 mg/dL — ABNORMAL HIGH (ref 0.0–40.0)

## 2018-06-08 LAB — COMPREHENSIVE METABOLIC PANEL
ALBUMIN: 4.2 g/dL (ref 3.5–5.2)
ALK PHOS: 112 U/L (ref 39–117)
ALT: 46 U/L (ref 0–53)
AST: 24 U/L (ref 0–37)
BILIRUBIN TOTAL: 0.4 mg/dL (ref 0.2–1.2)
BUN: 24 mg/dL — ABNORMAL HIGH (ref 6–23)
CALCIUM: 9.9 mg/dL (ref 8.4–10.5)
CO2: 33 mEq/L — ABNORMAL HIGH (ref 19–32)
CREATININE: 1.01 mg/dL (ref 0.40–1.50)
Chloride: 102 mEq/L (ref 96–112)
GFR: 81.82 mL/min (ref >60.00)
Glucose, Bld: 129 mg/dL — ABNORMAL HIGH (ref 70–99)
Potassium: 4.5 mEq/L (ref 3.5–5.1)
Sodium: 138 mEq/L (ref 135–145)
TOTAL PROTEIN: 7.1 g/dL (ref 6.0–8.3)

## 2018-06-08 LAB — PSA: PSA: 0.3 ng/mL (ref 0.10–4.00)

## 2018-06-08 LAB — LDL CHOLESTEROL, DIRECT: LDL DIRECT: 146 mg/dL

## 2018-06-08 LAB — HEMOGLOBIN A1C: Hgb A1c MFr Bld: 6.5 % (ref 4.6–6.5)

## 2018-06-08 LAB — TSH: TSH: 1.9 u[IU]/mL (ref 0.35–4.50)

## 2018-06-11 ENCOUNTER — Other Ambulatory Visit: Payer: Self-pay

## 2018-06-13 ENCOUNTER — Other Ambulatory Visit: Payer: Self-pay

## 2018-06-13 DIAGNOSIS — E785 Hyperlipidemia, unspecified: Secondary | ICD-10-CM

## 2018-06-13 MED ORDER — PRAVASTATIN SODIUM 40 MG PO TABS: 40.0000 mg | ORAL_TABLET | Freq: Every day | ORAL | 3 refills | Status: DC

## 2018-07-27 ENCOUNTER — Other Ambulatory Visit: Payer: Self-pay | Admitting: Internal Medicine

## 2018-07-30 ENCOUNTER — Encounter: Payer: Self-pay | Admitting: Gastroenterology

## 2018-07-30 ENCOUNTER — Ambulatory Visit (AMBULATORY_SURGERY_CENTER): Payer: Self-pay | Admitting: *Deleted

## 2018-07-30 ENCOUNTER — Other Ambulatory Visit: Payer: Self-pay

## 2018-07-30 VITALS — Ht 66.0 in | Wt 196.2 lb

## 2018-07-30 DIAGNOSIS — Z1211 Encounter for screening for malignant neoplasm of colon: Secondary | ICD-10-CM

## 2018-07-30 MED ORDER — SUPREP BOWEL PREP KIT 17.5-3.13-1.6 GM/177ML PO SOLN: 1.0000 | Freq: Once | ORAL | 0 refills | Status: AC

## 2018-07-30 NOTE — Progress Notes (Signed)
No egg or soy allergy known to patient  No issues with past sedation with any surgeries  or procedures, no intubation problems  No diet pills per patient No home 02 use per patient  No blood thinners per patient  Pt denies issues with constipation  No A fib or A flutter  EMMI video sent to pt's e mail  Verne Spurr, wife interpreter 15 dollar suprep coupon given

## 2018-08-13 ENCOUNTER — Encounter: Payer: Self-pay | Admitting: Gastroenterology

## 2018-08-13 ENCOUNTER — Telehealth: Payer: Self-pay | Admitting: *Deleted

## 2018-08-13 ENCOUNTER — Ambulatory Visit (AMBULATORY_SURGERY_CENTER): Payer: 59 | Admitting: Gastroenterology

## 2018-08-13 VITALS — BP 122/78 | HR 66 | Temp 98.4°F | Resp 12 | Ht 65.0 in | Wt 199.0 lb

## 2018-08-13 DIAGNOSIS — K635 Polyp of colon: Secondary | ICD-10-CM | POA: Diagnosis not present

## 2018-08-13 DIAGNOSIS — D124 Benign neoplasm of descending colon: Secondary | ICD-10-CM | POA: Diagnosis not present

## 2018-08-13 DIAGNOSIS — K5730 Diverticulosis of large intestine without perforation or abscess without bleeding: Secondary | ICD-10-CM

## 2018-08-13 DIAGNOSIS — Z1211 Encounter for screening for malignant neoplasm of colon: Secondary | ICD-10-CM | POA: Diagnosis not present

## 2018-08-13 MED ORDER — SODIUM CHLORIDE 0.9 % IV SOLN: 500.0000 mL | Freq: Once | INTRAVENOUS | Status: DC

## 2018-08-13 NOTE — Progress Notes (Signed)
Pt's states no medical or surgical changes since previsit or office visit. 

## 2018-08-13 NOTE — Patient Instructions (Signed)
Discharge instructions given. Handouts on polyps and Diverticulosis. Resume previous medications. YOU HAD AN ENDOSCOPIC PROCEDURE TODAY AT THE [facility]:   Refer to the procedure report that was given to you for any specific questions about what was found during the examination.  If the procedure report does not answer your questions, please call your gastroenterologist to clarify.  If you requested that your care partner not be given the details of your procedure findings, then the procedure report has been included in a sealed envelope for you to review at your convenience later.  YOU SHOULD EXPECT: Some feelings of bloating in the abdomen. Passage of more gas than usual.  Walking can help get rid of the air that was put into your GI tract during the procedure and reduce the bloating. If you had a lower endoscopy (such as a colonoscopy or flexible sigmoidoscopy) you may notice spotting of blood in your stool or on the toilet paper. If you underwent a bowel prep for your procedure, you may not have a normal bowel movement for a few days.  Please Note:  You might notice some irritation and congestion in your nose or some drainage.  This is from the oxygen used during your procedure.  There is no need for concern and it should clear up in a day or so.  SYMPTOMS TO REPORT IMMEDIATELY:   Following lower endoscopy (colonoscopy or flexible sigmoidoscopy):  Excessive amounts of blood in the stool  Significant tenderness or worsening of abdominal pains  Swelling of the abdomen that is new, acute  Fever of 100F or higher   For urgent or emergent issues, a gastroenterologist can be reached at any hour by calling [phone].   DIET:  We do recommend a small meal at first, but then you may proceed to your regular diet.  Drink plenty of fluids but you should avoid alcoholic beverages for 24 hours.  ACTIVITY:  You should plan to take it easy for the rest of today and you should NOT  DRIVE or use heavy machinery until tomorrow (because of the sedation medicines used during the test).    FOLLOW UP: Our staff will call the number listed on your records the next business day following your procedure to check on you and address any questions or concerns that you may have regarding the information given to you following your procedure. If we do not reach you, we will leave a message.  However, if you are feeling well and you are not experiencing any problems, there is no need to return our call.  We will assume that you have returned to your regular daily activities without incident.  If any biopsies were taken you will be contacted by phone or by letter within the next 1-3 weeks.  Please call us at [phone] if you have not heard about the biopsies in 3 weeks.    SIGNATURES/CONFIDENTIALITY: You and/or your care partner have signed paperwork which will be entered into your electronic medical record.  These signatures attest to the fact that that the information above on your After Visit Summary has been reviewed and is understood.  Full responsibility of the confidentiality of this discharge information lies with you and/or your care-partner.  

## 2018-08-13 NOTE — Op Note (Signed)
[facility] Patient Name: Jerry Sims Procedure Date: 08/13/2018 7:53 AM MRN: [id number] Endoscopist: Gerrit Heck , MD Age: 52 Referring MD:  Date of Birth: 25-Sep-1966 Gender: Male Account #: [id number] Procedure:                Colonoscopy Indications:              Screening for colorectal malignant neoplasm, This                            is the patient's first colonoscopy Medicines:                Monitored Anesthesia Care Procedure:                Pre-Anesthesia Assessment:                           - Prior to the procedure, a History and Physical                            was performed, and patient medications and                            allergies were reviewed. The patient's tolerance of                            previous anesthesia was also reviewed. The risks                            and benefits of the procedure and the sedation                            options and risks were discussed with the patient.                            All questions were answered, and informed consent                            was obtained. Prior Anticoagulants: The patient has                            taken no previous anticoagulant or antiplatelet                            agents. ASA Grade Assessment: II - A patient with                            mild systemic disease. After reviewing the risks                            and benefits, the patient was deemed in                            satisfactory condition to undergo the procedure.  After obtaining informed consent, the colonoscope                            was passed under direct vision. Throughout the                            procedure, the patient's blood pressure, pulse, and                            oxygen saturations were monitored continuously. The                            Colonoscope was introduced through the anus and                            advanced to the the cecum,  identified by                            appendiceal orifice and ileocecal valve. The                            colonoscopy was performed without difficulty. The                            patient tolerated the procedure well. The quality                            of the bowel preparation was adequate. Scope In: 7:56:22 AM Scope Out: 8:13:42 AM Scope Withdrawal Time: 0 hours 14 minutes 21 seconds  Total Procedure Duration: 0 hours 17 minutes 20 seconds  Findings:                 The perianal and digital rectal examinations were                            normal.                           A 3 mm polyp was found in the descending colon. The                            polyp was sessile. The polyp was removed with a                            cold snare. Resection and retrieval were complete.                            Estimated blood loss was minimal.                           Multiple small and large-mouthed diverticula were                            found in the sigmoid colon, descending colon and  ascending colon.                           The exam was otherwise normal throughout the                            examined colon.                           The retroflexed view of the distal rectum and anal                            verge was normal and showed no anal or rectal                            abnormalities. Complications:            No immediate complications. Estimated Blood Loss:     Estimated blood loss was minimal. Impression:               - One 3 mm polyp in the descending colon, removed                            with a cold snare. Resected and retrieved.                           - Diverticulosis in the sigmoid colon, in the                            descending colon and in the ascending colon.                           - The distal rectum and anal verge are normal on                            retroflexion view. Recommendation:           -  Patient has a contact number available for                            emergencies. The signs and symptoms of potential                            delayed complications were discussed with the                            patient. Return to normal activities tomorrow.                            Written discharge instructions were provided to the                            patient.                           - Resume previous diet today.                           -  Continue present medications.                           - Await pathology results.                           - Repeat colonoscopy in 5-10 years for surveillance                            based on pathology results.                           - Return to GI office PRN. Gerrit Heck, MD 08/13/2018 8:17:08 AM

## 2018-08-13 NOTE — Telephone Encounter (Signed)
Spoke with wife who was put through from the receptionist.  Wife told me that her husband was running a fever of 100.5.   He has no other symptoms, nor is her in pain.   I told her to give him a tylenol, and to call us if it goes up or he exhibits any other symptoms.  Dr. Bryan Lemma was notified by phone, and confirmed the instructions.

## 2018-08-13 NOTE — Progress Notes (Signed)
Called to room to assist during endoscopic procedure.  Patient ID and intended procedure confirmed with present staff. Received instructions for my participation in the procedure from the performing physician.  

## 2018-08-13 NOTE — Progress Notes (Signed)
To PACU, VSS. Report to [email or web address] 

## 2018-08-14 ENCOUNTER — Telehealth: Payer: Self-pay

## 2018-08-14 ENCOUNTER — Encounter: Payer: 59 | Admitting: Gastroenterology

## 2018-08-14 NOTE — Telephone Encounter (Signed)
First post procedure follow up call, no answer 

## 2018-08-14 NOTE — Telephone Encounter (Signed)
Second post procedure follow up call, no answer 

## 2018-08-17 ENCOUNTER — Encounter: Payer: Self-pay | Admitting: Gastroenterology

## 2019-01-14 ENCOUNTER — Other Ambulatory Visit: Payer: Self-pay

## 2019-01-14 ENCOUNTER — Ambulatory Visit (INDEPENDENT_AMBULATORY_CARE_PROVIDER_SITE_OTHER): Payer: 59 | Admitting: Internal Medicine

## 2019-01-14 DIAGNOSIS — E785 Hyperlipidemia, unspecified: Secondary | ICD-10-CM

## 2019-01-15 NOTE — Progress Notes (Signed)
Unable to contact the patient

## 2019-02-15 ENCOUNTER — Telehealth: Payer: Self-pay | Admitting: Internal Medicine

## 2019-02-18 NOTE — Telephone Encounter (Signed)
Called pt and pt stated was working will call later, Pt was informed provider needing to schedule an appt with provider.

## 2019-02-18 NOTE — Telephone Encounter (Signed)
Jerry Sims due for visit- we tried setting up visit on 01/14/2019 but was unable to contact him on day of appt. I have refilled his meds but needs virtual visit- can you set up please?

## 2019-05-11 ENCOUNTER — Encounter: Payer: Self-pay | Admitting: Internal Medicine

## 2019-06-16 ENCOUNTER — Other Ambulatory Visit: Payer: Self-pay | Admitting: Internal Medicine

## 2019-06-16 DIAGNOSIS — E785 Hyperlipidemia, unspecified: Secondary | ICD-10-CM

## 2019-06-17 MED ORDER — PRAVASTATIN SODIUM 40 MG PO TABS: 40.0000 mg | ORAL_TABLET | Freq: Every day | ORAL | 0 refills | Status: DC

## 2019-06-28 ENCOUNTER — Other Ambulatory Visit: Payer: Self-pay | Admitting: Internal Medicine

## 2019-06-28 DIAGNOSIS — E785 Hyperlipidemia, unspecified: Secondary | ICD-10-CM

## 2019-07-31 ENCOUNTER — Other Ambulatory Visit: Payer: Self-pay | Admitting: Internal Medicine

## 2019-07-31 DIAGNOSIS — E785 Hyperlipidemia, unspecified: Secondary | ICD-10-CM

## 2019-10-31 ENCOUNTER — Ambulatory Visit: Payer: HRSA Program | Attending: Internal Medicine

## 2019-10-31 DIAGNOSIS — Z20822 Contact with and (suspected) exposure to covid-19: Secondary | ICD-10-CM | POA: Diagnosis not present

## 2019-11-02 LAB — NOVEL CORONAVIRUS, NAA: SARS-CoV-2, NAA: NOT DETECTED

## 2019-11-17 LAB — NOVEL CORONAVIRUS, NAA: SARS-CoV-2, NAA: DETECTED

## 2019-11-21 ENCOUNTER — Other Ambulatory Visit (HOSPITAL_COMMUNITY): Payer: Self-pay | Admitting: Physician Assistant

## 2019-11-21 ENCOUNTER — Encounter: Payer: Self-pay | Admitting: Internal Medicine

## 2019-11-21 ENCOUNTER — Ambulatory Visit (INDEPENDENT_AMBULATORY_CARE_PROVIDER_SITE_OTHER): Payer: HRSA Program | Admitting: Internal Medicine

## 2019-11-21 VITALS — Ht 65.0 in | Wt 190.0 lb

## 2019-11-21 DIAGNOSIS — I10 Essential (primary) hypertension: Secondary | ICD-10-CM

## 2019-11-21 DIAGNOSIS — U071 COVID-19: Secondary | ICD-10-CM

## 2019-11-21 NOTE — Assessment & Plan Note (Signed)
  COVID-19: The patient developed symptoms 11/16/2019 and tested positive for the next day, he is feeling better. Nevertheless he likes to see if he qualify for monoclonal antibody therapy. Plan: Good hydration, Robitussin-DM if cough, continue checking temperature, Tylenol as needed, strict quarantin for 10 days after the test. Alarm symptoms that indicate the need for the ER discussed with the patient including chest pain, difficulty breathing. Monitor O2 sats if possible, if less than 94% go to the ER [facility] for the infusion center HTN: Reports good compliance with medication, I have not seen him in more than a year, request a refill.  BP is the last time he did few months ago was normal in the 120/80 range Plan: Refill for 1 month, arrange a visit in 2 weeks.  RTC 2 weeks (provided that he is better from Covid)

## 2019-11-21 NOTE — Progress Notes (Signed)
Pre visit review using our clinic review tool, if applicable. No additional management support is needed unless otherwise documented below in the visit note. 

## 2019-11-21 NOTE — Progress Notes (Signed)
  I connected by phone with Jerry Sims on 11/21/2019 at 1:17 PM to discuss the potential use of an new treatment for mild to moderate COVID-19 viral infection in non-hospitalized patients.  This patient is a 54 y.o. male that meets the FDA criteria for Emergency Use Authorization of bamlanivimab or casirivimab\imdevimab.  Has a (+) direct SARS-CoV-2 viral test result  Has mild or moderate COVID-19   Is ? 56 years of age and weighs ? 40 kg  Is NOT hospitalized due to COVID-19  Is NOT requiring oxygen therapy or requiring an increase in baseline oxygen flow rate due to COVID-19  Is within 10 days of symptom onset  Has at least one of the high risk factor(s) for progression to severe COVID-19 and/or hospitalization as defined in EUA.  Specific high risk criteria : Hypertension   I have spoken and communicated the following to the patient or parent/caregiver:  1. FDA has authorized the emergency use of bamlanivimab and casirivimab\imdevimab for the treatment of mild to moderate COVID-19 in adults and pediatric patients with positive results of direct SARS-CoV-2 viral testing who are 4 years of age and older weighing at least 40 kg, and who are at high risk for progressing to severe COVID-19 and/or hospitalization.  2. The significant known and potential risks and benefits of bamlanivimab and casirivimab\imdevimab, and the extent to which such potential risks and benefits are unknown.  3. Information on available alternative treatments and the risks and benefits of those alternatives, including clinical trials.  4. Patients treated with bamlanivimab and casirivimab\imdevimab should continue to self-isolate and use infection control measures (e.g., wear mask, isolate, social distance, avoid sharing personal items, clean and disinfect "high touch" surfaces, and frequent handwashing) according to CDC guidelines.   5. The patient or parent/caregiver has the option to accept or refuse  bamlanivimab or casirivimab\imdevimab .  After reviewing this information with the patient, The patient agreed to proceed with receiving the bamlanimivab infusion and will be provided a copy of the Fact sheet prior to receiving the infusion.   Jerry Sims 11/21/2019 1:17 PM

## 2019-11-21 NOTE — Progress Notes (Signed)
Subjective:    Patient ID: Jerry Sims, male    DOB: [birth year], 54 y.o.   MRN: [id number]  DOS:  11/21/2019 Type of visit - description: Virtual Visit via Telephone  Attempted  to make this a video visit, due to technical difficulties from the patient side it was not possible  thus we proceeded with a Virtual Visit via Telephone    I connected with above mentioned patient  by telephone and verified that I am speaking with the correct person using two identifiers.  THIS ENCOUNTER IS A VIRTUAL VISIT DUE TO COVID-19 - PATIENT WAS NOT SEEN IN THE OFFICE. PATIENT HAS CONSENTED TO VIRTUAL VISIT / TELEMEDICINE VISIT   Location of patient: home  Location of provider: office  I discussed the limitations, risks, security and privacy concerns of performing an evaluation and management service by telephone and the availability of in person appointments. I also discussed with the patient that there may be a patient responsible charge related to this service. The patient expressed understanding and agreed to proceed.  Acute Symptoms started 11/16/2019 with myalgias and fever up to 100.0 degrees. He was tested for Covid 11/17/2019 and it came back positive. His wife is also positive. Currently feeling better. In the last 2 days no fever, no cough. Denies nausea, vomiting, diarrhea. No chest pain no difficulty breathing. Myalgias are much decreased.    Review of Systems See above   Past Medical History:  Diagnosis Date  . At risk for sleep apnea    STOP-BANG= 4     SENT TO PCP 05-29-2014  . Extensor tenosynovitis of right wrist   . GERD (gastroesophageal reflux disease)   . Hyperlipidemia   . Hypertension   . Wears glasses     Past Surgical History:  Procedure Laterality Date  . CARPAL TUNNEL RELEASE Right 2010  . DORSAL COMPARTMENT RELEASE Right 06/05/2014   Procedure:  RIGHT WRIST FIRST/SECOND DORSAL COMPARTMENT TENOSYNOVECTOMY;  Surgeon: Linna Hoff, MD;  Location: [facility];  Service: Orthopedics;  Laterality: Right;  . NASAL SEPTUM SURGERY  2014        Objective:   Physical Exam Ht 5\' 5"  (1.651 m)   BMI 33.12 kg/m   This is a virtual telephone visit, he is alert oriented x3, no apparent distress, speaking in complete sentences     Assessment      Assessment    Hyperglycemia, A1c 6.3 2018  HTN Dyslipidemia LDL 182 ----> 11-2014, intolerant to Lipitor 06-2015  GERD At risk of OSA per stop bang 05-2014 : denied fatigue, snoring to me  PLAN COVID-19: The patient developed symptoms 11/16/2019 and tested positive for the next day, he is feeling better. Nevertheless he likes to see if he qualify for monoclonal antibody therapy. Plan: Good hydration, Robitussin-DM if cough, continue checking temperature, Tylenol as needed, strict quarantin for 10 days after the test. Alarm symptoms that indicate the need for the ER discussed with the patient including chest pain, difficulty breathing. Monitor O2 sats if possible, if less than 94% go to the ER [facility] for the infusion center HTN: Reports good compliance with medication, I have not seen him in more than a year, request a refill.  BP is the last time he did few months ago was normal in the 120/80 range Plan: Refill for 1 month, arrange a visit in 2 weeks.  RTC 2 weeks (provided that he is better from Covid)      I discussed the  assessment and treatment plan with the patient. The patient was provided an opportunity to ask questions and all were answered. The patient agreed with the plan and demonstrated an understanding of the instructions.   The patient was advised to call back or seek an in-person evaluation if the symptoms worsen or if the condition fails to improve as anticipated.  provided 22 minutes of non-face-to-face time during this encounter.  Kathlene November, MD

## 2019-11-22 ENCOUNTER — Encounter (HOSPITAL_COMMUNITY): Payer: Self-pay

## 2019-11-22 ENCOUNTER — Ambulatory Visit (HOSPITAL_COMMUNITY)
Admission: RE | Admit: 2019-11-22 | Discharge: 2019-11-22 | Disposition: A | Discharge status: Home / Self Care | Payer: HRSA Program | Source: Ambulatory Visit | Attending: Pulmonary Disease | Admitting: Pulmonary Disease

## 2019-11-22 DIAGNOSIS — U071 COVID-19: Secondary | ICD-10-CM | POA: Diagnosis present

## 2019-11-22 DIAGNOSIS — I10 Essential (primary) hypertension: Secondary | ICD-10-CM | POA: Insufficient documentation

## 2019-11-22 MED ORDER — DIPHENHYDRAMINE HCL 50 MG/ML IJ SOLN: 50.0000 mg | Freq: Once | INTRAMUSCULAR | Status: DC | PRN

## 2019-11-22 MED ORDER — FAMOTIDINE IN NACL 20-0.9 MG/50ML-% IV SOLN: 20.0000 mg | Freq: Once | INTRAVENOUS | Status: DC | PRN

## 2019-11-22 MED ORDER — ALBUTEROL SULFATE HFA 108 (90 BASE) MCG/ACT IN AERS: 2.0000 | INHALATION_SPRAY | Freq: Once | RESPIRATORY_TRACT | Status: DC | PRN

## 2019-11-22 MED ORDER — SODIUM CHLORIDE 0.9 % IV SOLN: INTRAVENOUS | Status: DC | PRN

## 2019-11-22 MED ORDER — METHYLPREDNISOLONE SODIUM SUCC 125 MG IJ SOLR: 125.0000 mg | Freq: Once | INTRAMUSCULAR | Status: DC | PRN

## 2019-11-22 MED ORDER — SODIUM CHLORIDE 0.9 % IV SOLN
700.0000 mg | Freq: Once | INTRAVENOUS | Status: AC
  Administered 2019-11-22: 700 mg via INTRAVENOUS
  Filled 2019-11-22: qty 20

## 2019-11-22 MED ORDER — EPINEPHRINE 0.3 MG/0.3ML IJ SOAJ: 0.3000 mg | Freq: Once | INTRAMUSCULAR | Status: DC | PRN

## 2019-11-22 NOTE — Progress Notes (Signed)
  Diagnosis: COVID-19  Physician: Dr  Joya Gaskins  Procedure: [facility] Med: bamlanivimab infusion - Provided patient with bamlanimivab fact sheet for patients, parents and caregivers prior to infusion.  Complications: No immediate complications noted.  Discharge: Discharged home   [place], [place] 11/22/2019

## 2019-11-22 NOTE — Discharge Instructions (Signed)
COVID-34 COVID-19 El COVID-19 es una infeccin respiratoria causada por un virus llamado coronavirus tipo 2 causante del sndrome respiratorio agudo grave (SARS-CoV-2). La enfermedad tambin se conoce como enfermedad por coronavirus o nuevo coronavirus. En algunas personas, el virus puede no ocasionar sntomas. En otras, puede producir una infeccin grave. La infeccin puede empeorar rpidamente y causar complicaciones, como:  Neumona o infeccin en los pulmones.  Sndrome de dificultad respiratoria aguda o SDRA. Es una afeccin que se caracteriza por la acumulacin de lquido en los pulmones, que impide que los pulmones se llenen de aire y pasen oxgeno a Herbalist.  Insuficiencia respiratoria aguda. Es una afeccin que se caracteriza porque no pasa suficiente oxgeno de los pulmones al cuerpo o porque el dixido de carbono no pasa de los pulmones hacia afuera del cuerpo.  Sepsis o choque sptico. Se trata de una reaccin grave del cuerpo ante una infeccin.  Problemas de coagulacin.  Infecciones secundarias debido a bacterias u hongos.  Falla de rganos. Ocurre cuando los rganos del cuerpo dejan de funcionar. El virus que causa el COVID-19 es contagioso. Esto significa que puede transmitirse de [place] persona a otra a travs de las gotitas de saliva de la tos y de los estornudos (secreciones respiratorias). Cules son las causas? Esta enfermedad es causada por un virus. Usted puede contagiarse con este virus:  Al inspirar las gotitas de una persona infectada. Las [employer] pueden diseminarse cuando una persona respira, habla, canta, tose o estornuda.  Al tocar algo, como una mesa o el picaportes de [place], que estuvo expuesto al virus (contaminado) y luego tocarse la boca, nariz o los ojos. Qu incrementa el riesgo? Riesgo de infeccin Es ms probable que se infecte con este virus si:  Se encuentra a Physiological scientist a 6 pies (2 metros) de Arts administrator con COVID-19.  Cuida o  vive con una persona infectada con COVID-19.  Pasa tiempo en espacios interiores repletos de gente o vive en viviendas compartidas. Riesgo de enfermedad grave Es ms probable que se enferme gravemente por el virus si:  Tiene 48aos o ms. Cuanto mayor sea su edad, mayor ser el riesgo de tener una enfermedad grave.  Vive en un hogar de ancianos o centro de atencin a Barrister's clerk.  Tiene cncer.  Tiene una enfermedad prolongada (crnica), como las siguientes: ? Enfermedad pulmonar crnica, que incluye la enfermedad pulmonar obstructiva crnica o asma. ? Una enfermedad crnica que disminuye la capacidad del cuerpo para combatir las infecciones (immunocomprometido). ? Enfermedad cardaca, que incluye insuficiencia cardaca, una afeccin que se caracteriza porque las arterias que llegan al corazn se Investment banker, corporate u obstruyen (arteriopata coronaria) o una enfermedad que provoca que el msculo cardaco se engrose, se debilite o endurezca (miocardiopata). ? Diabetes. ? Enfermedad renal crnica. ? Anemia drepanoctica, una enfermedad que se caracteriza porque los glbulos rojos tienen una forma anormal de "hoz". ? Enfermedad heptica.  Es obeso. Cules son los signos o sntomas? Los sntomas de esta afeccin pueden ser de leves a graves. Los sntomas pueden aparecer en el trmino de 2 a [address] despus de haber estado expuesto al virus. Incluyen los siguientes:  Fiebre o escalofros.  Tos.  Dificultad para respirar.  Dolores de [place], dolores en el cuerpo o dolores musculares.  Secrecin o congestin nasal.  Dolor de garganta.  Nueva prdida del sentido del gusto o del olfato. Algunas personas tambin pueden [employer], como nuseas, vmitos o diarrea. Es posible que otras personas no tengan sntomas de COVID-19.  Cmo se diagnostica? Esta afeccin se puede diagnosticar en funcin de lo siguiente:  Sus signos y sntomas, especialmente si: ? Vive en una zona  donde hay un brote de COVID-19. ? Viaj recientemente a una zona donde el virus es frecuente. ? Cuida o vive con Ardelia Mems persona a quien se le diagnostic COVID-19. ? [address] expuesto a una persona a la que se le diagnostic COVID-19.  Un examen fsico.  Anlisis de laboratorio que pueden incluir: ? Tomar una muestra de lquido de la parte posterior de la nariz y la garganta (lquido nasofarngeo), la nariz o la garganta, con un hisopo. ? Una muestra de mucosidad de los pulmones (esputo). ? Anlisis de Agency.  Los estudios de diagnstico por imgenes pueden incluir radiografas, exploracin por tomografa computarizada (TC) o ecografa. Cmo se trata? En este momento, no hay ningn medicamento para tratar el COVID-19. Los medicamentos para tratar otras enfermedades se usan a modo de ensayo para comprobar si son eficaces contra el COVID-19. El mdico le informar sobre las maneras de tratar los sntomas. En la [employer], la infeccin es leve y puede controlarse en el hogar con reposo, lquidos y medicamentos de [place]. El tratamiento para una infeccin grave suele realizarse en la unidad de cuidados intensivos (UCI) de un hospital. Puede incluir uno o ms de los siguientes. Estos tratamientos se administran hasta que los sntomas mejoran.  Recibir lquidos y [employer] a travs de una va intravenosa.  Oxgeno complementario. Para administrar oxgeno extra, se [place] un tubo en la [place]an [place], una mascarilla o una campana de oxgeno.  Colocarlo para que se recueste boca abajo (decbito prono). Esto facilita el ingreso de oxgeno a los pulmones.  Uso continuo de [place] de presin positiva de las vas areas (CPAP) o de presin positiva de las vas areas de dos niveles (BPAP). Este tratamiento utiliza una presin de aire leve para Theatre manager las vas respiratorias abiertas. Un tubo conectado a un motor administra oxgeno al cuerpo.  Respirador. Este tratamiento mueve el aire  dentro y fuera de los pulmones mediante el uso de un tubo que se coloca en la trquea.  Traqueostoma. En este procedimiento se hace un orificio en el cuello para insertar un tubo de respiracin.  Oxigenacin por membrana extracorprea (OMEC). En este procedimiento, los pulmones tienen la posibilidad de recuperarse al asumir las funciones del corazn y los pulmones. Suministra oxgeno al cuerpo y elimina el dixido de carbono. Siga estas instrucciones en su casa: Estilo de vida  Si est enfermo, qudese en su casa, excepto para obtener atencin mdica. El mdico le indicar cunto tiempo debe quedarse en casa. Llame al mdico antes de buscar atencin mdica.  Haga reposo en su casa como se lo haya indicado el mdico.  No consuma ningn producto que contenga nicotina o tabaco, como cigarrillos, cigarrillos electrnicos y tabaco de Higher education careers adviser. Si necesita ayuda para dejar de fumar, consulte al mdico.  Retome sus actividades normales segn lo indicado por el mdico. Pregntele al mdico qu actividades son seguras para usted. Instrucciones generales  Use los medicamentos de venta libre y los recetados solamente como se lo haya indicado el mdico.  Beba suficiente lquido como para Theatre manager la orina de color amarillo plido.  Concurra a todas las visitas de seguimiento como se lo haya indicado el mdico. Esto es importante. Cmo se evita?  No hay ninguna vacuna que ayude a prevenir la infeccin por COVID-19. Sin embargo, hay medidas que puede tomar para protegerse y Museum/gallery curator a  otras personas de [place] virus. Para protegerse:   No viaje a zonas donde el COVID-19 sea un riesgo. Las zonas donde se informa la presencia del COVID-19 cambian con frecuencia. Para identificar las zonas de alto riesgo y las restricciones de viaje, consulte el sitio web de viajes de Garment/textile technologist for [employer] and Prevention Librarian, academic) (Centros para el Control y la Prevencin de Arboriculturist):  FatFares.com.br  Si vive o debe viajar a una zona donde el COVID-19 es un riesgo, tome precauciones para evitar infecciones. ? Aljese de [place]. ? Lvese las manos frecuentemente con agua y [place]. Use desinfectante para manos con alcohol si no dispone de [place] y [place]. ? Evite tocarse la boca, la cara, los ojos o la [place]. ? Evite salir de su casa, siga las indicaciones de su estado y [place] autoridades sanitarias locales. ? Si debe salir de su casa, use un barbijo de tela o una mascarilla facial. Asegrese de que le cubra la nariz y la boca. ? Evite los espacios interiores repletos de gente. Mantenga una distancia de al menos 6 pies (2 metros) de Producer, television/film/video. ? Desinfecte los objetos y las superficies que se tocan con frecuencia todos [place]. Pueden incluir:  Encimeras y [place].  Picaportes e interruptores de luz.  Lavabos, fregaderos y grifos.  Aparatos electrnicos tales como telfonos, controles remotos, teclados, computadoras y tabletas. Cmo proteger a los dems: Si tiene sntomas de COVID-19, tome medidas para evitar que el virus se propague a Producer, television/film/video.  Si cree que tiene una infeccin por COVID-19, comunquese de inmediato con su mdico. Informe al equipo de atencin mdica que cree que puede tener una infeccin por el COVID-19.  Qudese en su casa. Salga de su casa solo para buscar atencin mdica. No utilice el transporte pblico.  No viaje mientras est enfermo.  Lvese las manos frecuentemente con agua y [place]. Usar desinfectante para manos con alcohol si no dispone de [place] y [place].  Mantngase alejado de quienes vivan con usted. Permita que los miembros de la familia sanos cuiden a los nios y las [place], si es posible. Si tiene que cuidar a los nios o las mascotas, lvese las manos con frecuencia y use un barbijo. Si es posible, permanezca en su habitacin, separado de los dems. Utilice un  bao diferente.  Asegrese de que todas las personas que viven en su casa se laven bien las manos y con frecuencia.  Tosa o estornude en un pauelo de papel o sobre su manga o codo. No tosa o estornude al aire ni se cubra la boca o la nariz con la [place].  Use un barbijo de tela o una mascarilla facial. Asegrese de que le cubra la nariz y la boca. Dnde buscar ms informacin  Centers for Disease Control and Prevention (Centros para el Control y la Prevencin de Arboriculturist): PurpleGadgets.be  World Health Organization (Organizacin Mundial de la Salud): https://www.castaneda.info/ Comunquese con un mdico si:  Vive o ha viajado a una zona donde el COVID-19 es un riesgo y tiene sntomas de infeccin.  Ha tenido contacto con alguien que tiene COVID-19 y usted tiene sntomas de infeccin. Solicite ayuda inmediatamente si:  Tiene dificultad para respirar.  Siente dolor u opresin en el pecho.  Experimenta confusin.  [place] uas de los dedos y los labios de color [place].  Tiene dificultad para despertarse.  Los sntomas empeoran. Estos sntomas pueden representar un problema grave que constituye Engineer, maintenance (IT). No espere  a ver si los sntomas desaparecen. Solicite atencin mdica de inmediato. Comunquese con el servicio de emergencias de su localidad (911 en los [place]). No conduzca por sus propios medios hasta el hospital. Informe al personal mdico de emergencias si cree que tiene COVID-19. Resumen  El COVID-19 es una infeccin respiratoria causada por un virus. Tambin se conoce como enfermedad por coronavirus o nuevo coronavirus. Puede causar infecciones graves, como neumona, sndrome de dificultad respiratoria aguda, insuficiencia respiratoria aguda o sepsis.  El virus que causa el COVID-19 es contagioso. Esto significa que puede transmitirse de una persona a otra a travs de las gotitas que se despiden al respirar, hablar,  cantar, toser y estornudar.  Es ms probable que desarrolle una enfermedad grave si tiene 50 aos o ms, tiene el sistema inmunitario dbil, vive en un hogar de ancianos o tiene una enfermedad crnica.  No hay ningn medicamento para tratar el COVID-19. El mdico le informar sobre las maneras de tratar los sntomas.  Tome medidas para protegerse y proteger a los dems contra las infecciones. Lvese las manos con frecuencia y desinfecte los objetos y las superficies que se tocan con frecuencia todos los das. Mantngase alejado de las personas que estn enfermas y use un barbijo si est enfermo. Esta informacin no tiene como fin reemplazar el consejo del mdico. Asegrese de hacerle al mdico cualquier pregunta que tenga. Document Revised: 08/08/2019 Document Reviewed: 12/01/2018 Elsevier Patient Education  2020 Elsevier Inc. What types of side effects do monoclonal antibody drugs cause?  Common side effects  In general, the more common side effects caused by monoclonal antibody drugs include: . Allergic reactions, such as hives or itching . Flu-like signs and symptoms, including chills, fatigue, fever, and muscle aches and pains . Nausea, vomiting . Diarrhea . Skin rashes . Low blood pressure   The CDC is recommending patients who receive monoclonal antibody treatments wait at least 90 days before being vaccinated.  Currently, there are no data on the safety and efficacy of mRNA COVID-19 vaccines in persons who received monoclonal antibodies or convalescent plasma as part of COVID-19 treatment. Based on the estimated half-life of such therapies as well as evidence suggesting that reinfection is uncommon in the 90 days after initial infection, vaccination should be deferred for at least 90 days, as a precautionary measure until additional information becomes available, to avoid interference of the antibody treatment with vaccine-induced immune responses.  

## 2020-08-21 ENCOUNTER — Encounter: Payer: Self-pay | Admitting: Internal Medicine

## 2020-08-21 ENCOUNTER — Emergency Department (HOSPITAL_BASED_OUTPATIENT_CLINIC_OR_DEPARTMENT_OTHER)
Admission: EM | Admit: 2020-08-21 | Discharge: 2020-08-21 | Disposition: A | Discharge status: Home / Self Care | Payer: 59 | Attending: Emergency Medicine | Admitting: Emergency Medicine

## 2020-08-21 ENCOUNTER — Ambulatory Visit (INDEPENDENT_AMBULATORY_CARE_PROVIDER_SITE_OTHER): Payer: 59 | Admitting: Internal Medicine

## 2020-08-21 ENCOUNTER — Encounter (HOSPITAL_BASED_OUTPATIENT_CLINIC_OR_DEPARTMENT_OTHER): Payer: Self-pay

## 2020-08-21 ENCOUNTER — Other Ambulatory Visit: Payer: Self-pay

## 2020-08-21 ENCOUNTER — Emergency Department (HOSPITAL_BASED_OUTPATIENT_CLINIC_OR_DEPARTMENT_OTHER): Payer: 59

## 2020-08-21 VITALS — BP 149/93 | HR 93 | Temp 98.8°F | Resp 16 | Ht 65.0 in | Wt 197.5 lb

## 2020-08-21 DIAGNOSIS — R1030 Lower abdominal pain, unspecified: Secondary | ICD-10-CM | POA: Diagnosis not present

## 2020-08-21 DIAGNOSIS — K219 Gastro-esophageal reflux disease without esophagitis: Secondary | ICD-10-CM | POA: Insufficient documentation

## 2020-08-21 DIAGNOSIS — R109 Unspecified abdominal pain: Secondary | ICD-10-CM | POA: Diagnosis present

## 2020-08-21 DIAGNOSIS — I10 Essential (primary) hypertension: Secondary | ICD-10-CM | POA: Diagnosis not present

## 2020-08-21 DIAGNOSIS — K5792 Diverticulitis of intestine, part unspecified, without perforation or abscess without bleeding: Secondary | ICD-10-CM

## 2020-08-21 DIAGNOSIS — Z79899 Other long term (current) drug therapy: Secondary | ICD-10-CM | POA: Insufficient documentation

## 2020-08-21 LAB — COMPREHENSIVE METABOLIC PANEL
ALT: 28 U/L (ref 0–44)
AST: 23 U/L (ref 15–41)
Albumin: 4.1 g/dL (ref 3.5–5.0)
Alkaline Phosphatase: 91 U/L (ref 38–126)
Anion gap: 10 (ref 5–15)
BUN: 14 mg/dL (ref 6–20)
CO2: 25 mmol/L (ref 22–32)
Calcium: 9.2 mg/dL (ref 8.9–10.3)
Chloride: 102 mmol/L (ref 98–111)
Creatinine, Ser: 1.05 mg/dL (ref 0.61–1.24)
GFR, Estimated: >60 mL/min (ref >60)
Glucose, Bld: 105 mg/dL — ABNORMAL HIGH (ref 70–99)
Potassium: 3.9 mmol/L (ref 3.5–5.1)
Sodium: 137 mmol/L (ref 135–145)
Total Bilirubin: 0.4 mg/dL (ref 0.3–1.2)
Total Protein: 7.6 g/dL (ref 6.5–8.1)

## 2020-08-21 LAB — POC URINALSYSI DIPSTICK (AUTOMATED)
Bilirubin, UA: NEGATIVE
Blood, UA: NEGATIVE
Glucose, UA: NEGATIVE
Leukocytes, UA: NEGATIVE
Nitrite, UA: NEGATIVE
Protein, UA: POSITIVE — AB
Spec Grav, UA: >=1.03 — AB (ref 1.010–1.025)
Urobilinogen, UA: 0.2 E.U./dL
pH, UA: 5.5 (ref 5.0–8.0)

## 2020-08-21 LAB — URINALYSIS, ROUTINE W REFLEX MICROSCOPIC
Bilirubin Urine: NEGATIVE
Glucose, UA: NEGATIVE mg/dL
Hgb urine dipstick: NEGATIVE
Ketones, ur: NEGATIVE mg/dL
Leukocytes,Ua: NEGATIVE
Nitrite: NEGATIVE
Protein, ur: NEGATIVE mg/dL
Specific Gravity, Urine: 1.025 (ref 1.005–1.030)
pH: 5.5 (ref 5.0–8.0)

## 2020-08-21 LAB — CBC
HCT: 41.8 % (ref 39.0–52.0)
Hemoglobin: 14.3 g/dL (ref 13.0–17.0)
MCH: 31.7 pg (ref 26.0–34.0)
MCHC: 34.2 g/dL (ref 30.0–36.0)
MCV: 92.7 fL (ref 80.0–100.0)
Platelets: 262 10*3/uL (ref 150–400)
RBC: 4.51 MIL/uL (ref 4.22–5.81)
RDW: 13.3 % (ref 11.5–15.5)
WBC: 14.4 10*3/uL — ABNORMAL HIGH (ref 4.0–10.5)
nRBC: 0 % (ref 0.0–0.2)

## 2020-08-21 LAB — LIPASE, BLOOD: Lipase: 26 U/L (ref 11–51)

## 2020-08-21 MED ORDER — ONDANSETRON HCL 4 MG/2ML IJ SOLN
4.0000 mg | Freq: Once | INTRAMUSCULAR | Status: AC
  Administered 2020-08-21: 4 mg via INTRAVENOUS
  Filled 2020-08-21: qty 2

## 2020-08-21 MED ORDER — HYDROCODONE-ACETAMINOPHEN 5-325 MG PO TABS: 1.0000 | ORAL_TABLET | Freq: Four times a day (QID) | ORAL | 0 refills | Status: DC | PRN

## 2020-08-21 MED ORDER — IOHEXOL 300 MG/ML  SOLN
100.0000 mL | Freq: Once | INTRAMUSCULAR | Status: AC | PRN
  Administered 2020-08-21: 100 mL via INTRAVENOUS

## 2020-08-21 MED ORDER — METRONIDAZOLE 500 MG PO TABS
500.0000 mg | ORAL_TABLET | Freq: Once | ORAL | Status: AC
  Administered 2020-08-21: 500 mg via ORAL
  Filled 2020-08-21: qty 1

## 2020-08-21 MED ORDER — CIPROFLOXACIN HCL 500 MG PO TABS: 500.0000 mg | ORAL_TABLET | Freq: Two times a day (BID) | ORAL | 0 refills | Status: AC

## 2020-08-21 MED ORDER — METRONIDAZOLE 500 MG PO TABS: 500.0000 mg | ORAL_TABLET | Freq: Two times a day (BID) | ORAL | 0 refills | Status: DC

## 2020-08-21 MED ORDER — MORPHINE SULFATE (PF) 4 MG/ML IV SOLN
4.0000 mg | Freq: Once | INTRAVENOUS | Status: AC
  Administered 2020-08-21: 4 mg via INTRAVENOUS
  Filled 2020-08-21: qty 1

## 2020-08-21 MED ORDER — SODIUM CHLORIDE 0.9 % IV BOLUS
1000.0000 mL | Freq: Once | INTRAVENOUS | Status: AC
  Administered 2020-08-21: 1000 mL via INTRAVENOUS

## 2020-08-21 MED ORDER — CIPROFLOXACIN HCL 500 MG PO TABS
500.0000 mg | ORAL_TABLET | Freq: Once | ORAL | Status: AC
  Administered 2020-08-21: 500 mg via ORAL
  Filled 2020-08-21: qty 1

## 2020-08-21 NOTE — Discharge Instructions (Signed)
As we discussed, your work-up here today showed diverticulitis.  We will plan to start you on antibiotics.  I will also send you home with some pain medication for breakthrough pain.  Follow-up with your primary care doctor.  Return the emergency department for any worsening pain, fever, vomiting or any other worsening concerning symptoms.

## 2020-08-21 NOTE — Progress Notes (Signed)
Subjective:    Patient ID: Jerry Sims, male    DOB: 06-May-1966, 54 y.o.   MRN: [id number]  DOS:  08/21/2020 Type of visit - description: Acute Symptoms started 2 to 3 days ago. Reports frequent episodes of low abdominal pain, at the suprapubic area, no radiation to the left or right, sometimes radiate to the testicles but denied testicular swelling. Pain is not intense, around at 3/10   Review of Systems Reports subjective fever Appetite is okay but has not been eating much, he is afraid to eat. Last BM was yesterday, small amount No nausea, vomiting, diarrhea No dysuria, gross hematuria difficulty urinating.  Past Medical History:  Diagnosis Date  . At risk for sleep apnea    STOP-BANG= 4     SENT TO PCP 05-29-2014  . Extensor tenosynovitis of right wrist   . GERD (gastroesophageal reflux disease)   . Hyperlipidemia   . Hypertension   . Wears glasses     Past Surgical History:  Procedure Laterality Date  . CARPAL TUNNEL RELEASE Right 2010  . DORSAL COMPARTMENT RELEASE Right 06/05/2014   Procedure:  RIGHT WRIST FIRST/SECOND DORSAL COMPARTMENT TENOSYNOVECTOMY;  Surgeon: Linna Hoff, MD;  Location: [facility];  Service: Orthopedics;  Laterality: Right;  . NASAL SEPTUM SURGERY  2014    Allergies as of 08/21/2020      Reactions   Lipitor [atorvastatin] Other (See Comments)   Heartburn and HAs      Medication List       Accurate as of August 21, 2020  3:29 PM. If you have any questions, ask your nurse or doctor.        lisinopril-hydrochlorothiazide 10-12.5 MG tablet Commonly known as: ZESTORETIC Take 1 tablet by mouth daily.          Objective:   Physical Exam BP (!) 149/93 (BP Location: Left Arm, Patient Position: Sitting, Cuff Size: Small)   Pulse 93   Temp 98.8 F (37.1 C) (Oral)   Resp 16   Ht 5\' 5"  (1.651 m)   Wt 197 lb 8 oz (89.6 kg)   SpO2 97%   BMI 32.87 kg/m  General:   Well developed, NAD, BMI noted.  HEENT:    Normocephalic . Face symmetric, atraumatic Lungs:  CTA B Normal respiratory effort, no intercostal retractions, no accessory muscle use. Heart: RRR,  no murmur.  Abdomen:  Not distended, soft, + tender at the lower abdomen without mass or rebound. No CVA tenderness.  No inguinal mass or hernia. GU: Scrotal contents normal, penis with no discharge Skin: Not pale. Not jaundice Lower extremities: no pretibial edema bilaterally  Neurologic:  alert & oriented X3.  Speech normal, gait appropriate for age and unassisted Psych--  Cognition and judgment appear intact.  Cooperative with normal attention span and concentration.  Behavior appropriate. No anxious or depressed appearing.     Assessment     Assessment    Hyperglycemia, A1c 6.3 2018  HTN Dyslipidemia LDL 182 ----> 11-2014, intolerant to Lipitor 06-2015  GERD At risk of OSA per stop bang 05-2014 : denied fatigue, snoring to me  PLAN Lower abdominal pain: Symptoms started 2 to 3 days ago, he is tender at the lower abdomen, he is not febrile but heart rate is slightly elevated from baseline. U dip negative except for ketones. Must rule out diverticulitis, early appendicitis versus others. I spoke to the ER physician who agreed to see the patient, appreciate her help. Patient in agreement, states  he will go to the ER and verbalized understanding    This visit occurred during the SARS-CoV-2 public health emergency.  Safety protocols were in place, including screening questions prior to the visit, additional usage of staff PPE, and extensive cleaning of exam room while observing appropriate contact time as indicated for disinfecting solutions.

## 2020-08-21 NOTE — Patient Instructions (Signed)
Please go to the ER at the first floor

## 2020-08-21 NOTE — ED Provider Notes (Signed)
[facility] EMERGENCY DEPARTMENT Provider Note   CSN: [id number] Arrival date & time: 08/21/20  1550     History Chief Complaint  Patient presents with   Abdominal Pain    Jerry Sims is a 54 y.o. male who presents for evaluation of abdominal pain x3 days.  He states that he started having some central abdominal pain that radiated to the left about 3 days ago.  He states that the pain has been constant over the last 3 days but will vary in intensity.  He states that when the pain gets very bad, he feels like it radiates into both of his testicles.  He states he has not taken anything for the pain.  There is nothing that makes the pain better or worse.  He has not noted any dysuria, hematuria.  He states that he has not had any fever and states he has been able to eat and drink with any difficulty.  The pain is not affected by eating or drinking.  He denies any chest pain, difficulty breathing, cough.   The history is provided by the patient. A language interpreter was used.       Past Medical History:  Diagnosis Date   At risk for sleep apnea    STOP-BANG= 4     SENT TO PCP 05-29-2014   Extensor tenosynovitis of right wrist    GERD (gastroesophageal reflux disease)    Hyperlipidemia    Hypertension    Wears glasses     Patient Active Problem List   Diagnosis Date Noted   Hyperlipidemia 01/05/2017   PCP NOTES >>>>>>>>>>>>. 07/09/2015   Annual physical exam 11/26/2014   Hypertension 11/26/2014   LEG PAIN, BILATERAL 12/09/2008   PARESTHESIA 12/09/2008   ESOPHAGEAL REFLUX 05/14/2008    Past Surgical History:  Procedure Laterality Date   CARPAL TUNNEL RELEASE Right 2010   DORSAL COMPARTMENT RELEASE Right 06/05/2014   Procedure:  RIGHT WRIST FIRST/SECOND DORSAL COMPARTMENT TENOSYNOVECTOMY;  Surgeon: Linna Hoff, MD;  Location: [facility];  Service: Orthopedics;  Laterality: Right;   NASAL SEPTUM SURGERY  2014       Family  History  Problem Relation Age of Onset   Diabetes Other        GM, uncle    Colon cancer Neg Hx    Prostate cancer Neg Hx    CAD Neg Hx    Colon polyps Neg Hx    Esophageal cancer Neg Hx    Stomach cancer Neg Hx    Rectal cancer Neg Hx     Social History   Tobacco Use   Smoking status: Never Smoker   Smokeless tobacco: Never Used  Substance Use Topics   Alcohol use: Yes    Comment: OCCASIONAL   Drug use: No    Home Medications Prior to Admission medications   Medication Sig Start Date End Date Taking? Authorizing Provider  ciprofloxacin (CIPRO) 500 MG tablet Take 1 tablet (500 mg total) by mouth every 12 (twelve) hours for 7 days. 08/21/20 08/28/20  Volanda Napoleon, PA-C  HYDROcodone-acetaminophen (NORCO/VICODIN) 5-325 MG tablet Take 1-2 tablets by mouth every 6 (six) hours as needed. 08/21/20   Volanda Napoleon, PA-C  lisinopril-hydrochlorothiazide (ZESTORETIC) 10-12.5 MG tablet Take 1 tablet by mouth daily. Patient not taking: Reported on 08/21/2020 06/17/19   Colon Branch, MD  metroNIDAZOLE (FLAGYL) 500 MG tablet Take 1 tablet (500 mg total) by mouth 2 (two) times daily. 08/21/20   Vaidehi Braddy,  Tyna Jaksch, PA-C    Allergies    Lipitor [atorvastatin]  Review of Systems   Review of Systems  Constitutional: Negative for fever.  Respiratory: Negative for cough and shortness of breath.   Cardiovascular: Negative for chest pain.  Gastrointestinal: Positive for abdominal pain. Negative for nausea and vomiting.  Genitourinary: Positive for testicular pain. Negative for dysuria, hematuria, penile swelling and scrotal swelling.  Neurological: Negative for headaches.  All other systems reviewed and are negative.   Physical Exam Updated Vital Signs BP (!) 160/99    Pulse 76    Temp 98 F (36.7 C)    Resp 18    SpO2 100%   Physical Exam Vitals and nursing note reviewed. Exam conducted with a chaperone present.  Constitutional:      Appearance: Normal appearance. He is  well-developed.  HENT:     Head: Normocephalic and atraumatic.  Eyes:     General: Lids are normal.     Conjunctiva/sclera: Conjunctivae normal.     Pupils: Pupils are equal, round, and reactive to light.  Cardiovascular:     Rate and Rhythm: Normal rate and regular rhythm.     Pulses: Normal pulses.     Heart sounds: Normal heart sounds. No murmur heard.  No friction rub. No gallop.   Pulmonary:     Effort: Pulmonary effort is normal.     Breath sounds: Normal breath sounds.     Comments: Lungs clear to auscultation bilaterally.  Symmetric chest rise.  No wheezing, rales, rhonchi. Abdominal:     Palpations: Abdomen is soft. Abdomen is not rigid.     Tenderness: There is abdominal tenderness in the periumbilical area and left lower quadrant. There is no guarding.     Comments: Tenderness palpation mid abdomen that radiates into the left.  No CVA tenderness noted bilaterally.  No rigidity, guarding.  Genitourinary:    Comments: The exam was performed with a chaperone present Marya Amsler, Therapist, sports).  He has a 1 cm soft nodule noted to the shaft of the penis.  He also has a solid rectangular structure noted to the shaft of the penis.  Patient states both of these things have been there for several years and are not new.  He has no pain associated with them.  No tenderness palpation of bilateral testicles.  No overlying warmth, erythema, edema. Musculoskeletal:        General: Normal range of motion.     Cervical back: Full passive range of motion without pain.  Skin:    General: Skin is warm and dry.     Capillary Refill: Capillary refill takes less than 2 seconds.  Neurological:     Mental Status: He is alert and oriented to person, place, and time.  Psychiatric:        Speech: Speech normal.     ED Results / Procedures / Treatments   Labs (all labs ordered are listed, but only abnormal results are displayed) Labs Reviewed  COMPREHENSIVE METABOLIC PANEL - Abnormal; Notable for the following  components:      Result Value   Glucose, Bld 105 (*)    All other components within normal limits  CBC - Abnormal; Notable for the following components:   WBC 14.4 (*)    All other components within normal limits  LIPASE, BLOOD  URINALYSIS, ROUTINE W REFLEX MICROSCOPIC    EKG None  Radiology CT Abdomen Pelvis W Contrast  Result Date: 08/21/2020 CLINICAL DATA:  Abdominal pain for several days  EXAM: CT ABDOMEN AND PELVIS WITH CONTRAST TECHNIQUE: Multidetector CT imaging of the abdomen and pelvis was performed using the standard protocol following bolus administration of intravenous contrast. CONTRAST:  151mL OMNIPAQUE IOHEXOL 300 MG/ML  SOLN COMPARISON:  None. FINDINGS: Lower chest: No acute abnormality. Hepatobiliary: A few small cysts are noted within the liver. Mild fatty infiltration is seen. Gallbladder is within normal limits. Pancreas: Unremarkable. No pancreatic ductal dilatation or surrounding inflammatory changes. Spleen: Normal in size without focal abnormality. Adrenals/Urinary Tract: Adrenal glands are within normal limits. Kidneys demonstrate a normal enhancement pattern bilaterally. No renal calculi or obstructive changes are seen. Normal excretion of contrast is noted on delayed images. Ureters are within normal limits. Bladder is partially distended. Stomach/Bowel: Colon shows evidence of diverticulitis within the distal descending colon and proximal sigmoid colon. No abscess or perforation is noted. The more proximal colon appears within normal limits. The appendix is unremarkable. Small bowel shows no focal abnormality. The stomach is unremarkable as well. Vascular/Lymphatic: Aortic atherosclerosis. No enlarged abdominal or pelvic lymph nodes. Reproductive: Prostate is unremarkable. Other: No abdominal wall hernia or abnormality. No abdominopelvic ascites. Musculoskeletal: No acute abnormality noted. IMPRESSION: Changes of diverticulitis as described without evidence of perforation  or abscess formation. Scattered hepatic cysts. Electronically Signed   By: Inez Catalina M.D.   On: 08/21/2020 18:22    Procedures Procedures (including critical care time)  Medications Ordered in ED Medications  ondansetron (ZOFRAN) injection 4 mg (4 mg Intravenous Given 08/21/20 1733)  sodium chloride 0.9 % bolus 1,000 mL (0 mLs Intravenous Stopped 08/21/20 1854)  morphine 4 MG/ML injection 4 mg (4 mg Intravenous Given 08/21/20 1734)  iohexol (OMNIPAQUE) 300 MG/ML solution 100 mL (100 mLs Intravenous Contrast Given 08/21/20 1801)  ciprofloxacin (CIPRO) tablet 500 mg (500 mg Oral Given 08/21/20 1857)  metroNIDAZOLE (FLAGYL) tablet 500 mg (500 mg Oral Given 08/21/20 1857)    ED Course  I have reviewed the triage vital signs and the nursing notes.  Pertinent labs & imaging results that were available during my care of the patient were reviewed by me and considered in my medical decision making (see chart for details).    MDM Rules/Calculators/A&P                          54 year old male who presents for evaluation of abdominal pain x3 days.  He states that it starts in the center and radiates to the left.  Occasional radiating to his testicles.  No urinary complaints, nausea/vomiting, fevers.  On initial arrival, he is afebrile nontoxic-appearing.  Vital signs are stable.  On exam, his tenderness noted to the mid abdomen radiates in the center.  Testicular exam shows no obvious tenderness, warmth, area of erythema, swelling.  Question if this is infectious etiology versus kidney stone versus diverticulitis.  Plan to check labs, imaging.  CBC shows slight leukocytosis of 14.4.  Lipase is 26.  CMP shows BUN of creatinine within normal limits.  UA shows no evidence of infectious etiology.  CT ab pelvis shows changes of diverticulitis as described without any evidence of perforation or abscess formation.  Appendix is normal.  Using the [place] interpreter, I discussed the results with patient.   Patient reports improvement in his pain after analgesics here in the ED.  Repeat abdominal exam shows improvement.  Patient with allergy to Lipitor.  We will start him on Cipro Flagyl.  Discussed with patient that it is very important that he takes  antibiotics.  Encouraged at home supportive care measures.  We will send him home on a short course of pain medication for acute breakthrough pain. At this time, patient exhibits no emergent life-threatening condition that require further evaluation in ED. Patient had ample opportunity for questions and discussion. All patient's questions were answered with full understanding. Strict return precautions discussed. Patient expresses understanding and agreement to plan.   Portions of this note were generated with Lobbyist. Dictation errors may occur despite best attempts at proofreading.   Final Clinical Impression(s) / ED Diagnoses Final diagnoses:  Diverticulitis    Rx / DC Orders ED Discharge Orders         Ordered    ciprofloxacin (CIPRO) 500 MG tablet  Every 12 hours        08/21/20 1924    metroNIDAZOLE (FLAGYL) 500 MG tablet  2 times daily        08/21/20 1924    HYDROcodone-acetaminophen (NORCO/VICODIN) 5-325 MG tablet  Every 6 hours PRN        08/21/20 1924           Desma Mcgregor 08/21/20 2226    Malvin Johns, MD 08/21/20 2317

## 2020-08-21 NOTE — Progress Notes (Signed)
Pre visit review using our clinic review tool, if applicable. No additional management support is needed unless otherwise documented below in the visit note. 

## 2020-08-21 NOTE — ED Triage Notes (Signed)
Pt c/o lower abd pain x 3 days-denies v/d-sent from PCP office-NAD-steady gait

## 2020-08-22 NOTE — Assessment & Plan Note (Signed)
Lower abdominal pain: Symptoms started 2 to 3 days ago, he is tender at the lower abdomen, he is not febrile but heart rate is slightly elevated from baseline. U dip negative except for ketones. Must rule out diverticulitis, early appendicitis versus others. I spoke to the ER physician who agreed to see the patient, appreciate her help. Patient in agreement, states he will go to the ER and verbalized understanding

## 2020-09-04 ENCOUNTER — Ambulatory Visit (INDEPENDENT_AMBULATORY_CARE_PROVIDER_SITE_OTHER): Payer: 59 | Admitting: Internal Medicine

## 2020-09-04 ENCOUNTER — Other Ambulatory Visit: Payer: Self-pay

## 2020-09-04 ENCOUNTER — Encounter: Payer: Self-pay | Admitting: Internal Medicine

## 2020-09-04 DIAGNOSIS — K5792 Diverticulitis of intestine, part unspecified, without perforation or abscess without bleeding: Secondary | ICD-10-CM | POA: Diagnosis not present

## 2020-09-04 NOTE — Progress Notes (Signed)
Pre visit review using our clinic review tool, if applicable. No additional management support is needed unless otherwise documented below in the visit note. 

## 2020-09-04 NOTE — Progress Notes (Signed)
Subjective:    Patient ID: Jerry Sims, male    DOB: [birth year], 54 y.o.   MRN: [id number]  DOS:  09/04/2020 Type of visit - description: er f/u   Was seen here at this office with abdominal pain, subsequently was seen at the ER, Abdominal CT showed diverticulitis.  Was treated with Cipro and Flagyl. Blood work was okay, WBCs were elevated at 14.4. Here for follow-up  Review of Systems He finished antibiotics as prescribed by the ER Currently asymptomatic. Specifically no abdominal pain, no fever chills. No nausea or vomiting  Past Medical History:  Diagnosis Date  . At risk for sleep apnea    STOP-BANG= 4     SENT TO PCP 05-29-2014  . Extensor tenosynovitis of right wrist   . GERD (gastroesophageal reflux disease)   . Hyperlipidemia   . Hypertension   . Wears glasses     Past Surgical History:  Procedure Laterality Date  . CARPAL TUNNEL RELEASE Right 2010  . DORSAL COMPARTMENT RELEASE Right 06/05/2014   Procedure:  RIGHT WRIST FIRST/SECOND DORSAL COMPARTMENT TENOSYNOVECTOMY;  Surgeon: Linna Hoff, MD;  Location: [facility];  Service: Orthopedics;  Laterality: Right;  . NASAL SEPTUM SURGERY  2014    Allergies as of 09/04/2020      Reactions   Lipitor [atorvastatin] Other (See Comments)   Heartburn and HAs      Medication List       Accurate as of September 04, 2020 11:59 PM. If you have any questions, ask your nurse or doctor.        STOP taking these medications   HYDROcodone-acetaminophen 5-325 MG tablet Commonly known as: NORCO/VICODIN Stopped by: Kathlene November, MD   lisinopril-hydrochlorothiazide 10-12.5 MG tablet Commonly known as: ZESTORETIC Stopped by: Kathlene November, MD   metroNIDAZOLE 500 MG tablet Commonly known as: FLAGYL Stopped by: Kathlene November, MD          Objective:   Physical Exam BP 130/86 (BP Location: Left Arm, Patient Position: Sitting, Cuff Size: Large)   Pulse (!) 57   Temp 98 F (36.7 C) (Oral)   Resp 18   Ht 5\' 5"   (1.651 m)   Wt 187 lb (84.8 kg)   SpO2 98%   BMI 31.12 kg/m  General:   Well developed, NAD, BMI noted.  HEENT:  Normocephalic . Face symmetric, atraumatic  Abdomen:  Not distended, soft, non-tender. No rebound or rigidity.   Skin: Not pale. Not jaundice Lower extremities: no pretibial edema bilaterally  Neurologic:  alert & oriented X3.  Speech normal, gait appropriate for age and unassisted Psych--  Cognition and judgment appear intact.  Cooperative with normal attention span and concentration.  Behavior appropriate. No anxious or depressed appearing.     Assessment    Assessment    Hyperglycemia, A1c 6.3 2018  HTN Dyslipidemia LDL 182 ----> 11-2014, intolerant to Lipitor 06-2015  GERD At risk of OSA per stop bang 05-2014 : denied fatigue, snoring to me COVID-19: February 2021, had monoclonal antibody infusion. Diverticulitis: First episode 08-2020.  PLAN Diverticulitis: Patient was referred to the ER on 26/11/353, CT show uncomplicated diverticulitis, had antibiotics, currently asx Last colonoscopy was 07/2018, one hyperplastic polyp, diverticuli. At this point I do not think he needs to repeat a colonoscopy,  he is doing well clinically. Explained to patient what is diverticulitis, recommend to avoid constipation, eat high fiber diet. History of hyperglycemia, HTN, dyslipidemia: Currently taking no medicines. Preventive care: Declines flu shot or  a Covid vaccination, benefits discussed. Encouraged to give me the chance to do a physical within the next 2 months.    This visit occurred during the SARS-CoV-2 public health emergency.  Safety protocols were in place, including screening questions prior to the visit, additional usage of staff PPE, and extensive cleaning of exam room while observing appropriate contact time as indicated for disinfecting solutions.

## 2020-09-04 NOTE — Patient Instructions (Addendum)
Happy Holidays!    GO TO THE FRONT DESK, PLEASE SCHEDULE YOUR APPOINTMENTS Come back for  A physical exam within the next 2-3 months    Diverticulitis Diverticulitis  La diverticulitis ocurre cuando pequeos bolsillos que se han formado en el intestino grueso (colon) se infectan o se inflaman. Esto produce dolor de Paramedic y heces lquidas (diarrea). Estas bolsas en el colon se denominan divertculos. Se forman en las personas que tienen una afeccin llamada diverticulitis. Siga estas indicaciones en su casa: Medicamentos  [employer] de venta libre y los recetados solamente como se lo haya indicado el mdico. Estos incluyen los siguientes: ? Antibiticos. ? Analgsicos. ? Pastillas de [place]. ? Probiticos. ? Laxantes.  No conduzca ni use maquinaria pesada mientras toma analgsicos recetados.  Si le recetaron un antibitico, tmelo como se lo hayan indicado. No deje de tomarlos aunque se sienta mejor. Instrucciones generales   Siga la dieta como se lo haya indicado el mdico.  Cuando se sienta mejor, el mdico puede indicarle que cambie la dieta. Tal vez necesite ingerir gran cantidad de fibra. La fibra facilita la evacuacin intestinal (defecacin). Entre los alimentos saludables con [place], se incluyen los siguientes: ? Frutos rojos. ? Frijoles. ? Lentejas. ? Verduras de [employer].  Haga ejercicios 3 o ms veces por semana. Hgalos durante 30 minutos cada vez. Ejerctese lo suficiente como para transpirar y Conservation officer, historic buildings los latidos cardacos.  Concurra a todas las visitas de control como se lo hayan indicado. Esto es importante. Puede que tenga que someterse a un examen del intestino grueso. Esto se denomina colonoscopia. Comunquese con un mdico si:  El dolor no mejora.  Le cuesta mucho comer o beber.  No defeca como lo hace normalmente. Solicite ayuda de inmediato si:  El Holiday representative.  Los problemas no mejoran.  Los problemas empeoran muy  rpidamente.  Tiene fiebre.  Devuelve (vomita) ms de una vez.  Sus heces tienen las siguientes caractersticas: ? Teacher, English as a foreign language. ? Son de color negro. ? Son alquitranadas. Resumen  La diverticulitis ocurre cuando pequeos bolsillos que se han formado en el intestino grueso (colon) se infectan o se inflaman.  Tome los medicamentos solamente como se lo haya indicado el mdico.  Siga la dieta como se lo haya indicado el mdico. Esta informacin no tiene Marine scientist el consejo del mdico. Asegrese de hacerle al mdico cualquier pregunta que tenga. Document Revised: 04/06/2017 Document Reviewed: 04/06/2017 Elsevier Patient Education  [place].

## 2020-09-05 DIAGNOSIS — K5792 Diverticulitis of intestine, part unspecified, without perforation or abscess without bleeding: Secondary | ICD-10-CM | POA: Insufficient documentation

## 2020-09-05 NOTE — Assessment & Plan Note (Signed)
Diverticulitis: Patient was referred to the ER on 31/03/7424, CT show uncomplicated diverticulitis, had antibiotics, currently asx Last colonoscopy was 07/2018, one hyperplastic polyp, diverticuli. At this point I do not think he needs to repeat a colonoscopy,  he is doing well clinically. Explained to patient what is diverticulitis, recommend to avoid constipation, eat high fiber diet. History of hyperglycemia, HTN, dyslipidemia: Currently taking no medicines. Preventive care: Declines flu shot or a Covid vaccination, benefits discussed. Encouraged to give me the chance to do a physical within the next 2 months.

## 2020-11-03 ENCOUNTER — Encounter: Payer: 59 | Admitting: Internal Medicine

## 2020-12-16 ENCOUNTER — Ambulatory Visit: Payer: 59 | Admitting: Internal Medicine

## 2020-12-16 ENCOUNTER — Other Ambulatory Visit: Payer: Self-pay

## 2020-12-16 VITALS — BP 179/99 | HR 55 | Temp 98.1°F | Ht 65.0 in | Wt 195.0 lb

## 2020-12-16 DIAGNOSIS — M25511 Pain in right shoulder: Secondary | ICD-10-CM | POA: Diagnosis not present

## 2020-12-16 DIAGNOSIS — I10 Essential (primary) hypertension: Secondary | ICD-10-CM | POA: Diagnosis not present

## 2020-12-16 MED ORDER — AMLODIPINE BESYLATE 5 MG PO TABS: 5.0000 mg | ORAL_TABLET | Freq: Every day | ORAL | 1 refills | Status: DC

## 2020-12-16 NOTE — Patient Instructions (Addendum)
For pain: Tylenol  500 mg OTC 2 tabs a day every 8 hours as needed for pain  IBUPROFEN (Advil or Motrin) 200 mg 2 tablets a day.  Always take it with food because may cause gastritis and ulcers.  If you notice nausea, stomach pain, change in the color of stools --->  Stop the medicine and let us know  Will refer you to sports medicine    Start amlodipine 5 mg 1 day for blood pressure   Check the  blood pressure 2 or 3 times a week BP GOAL is between 110/65 and  135/85. If it is consistently higher or lower, let me know   GO TO THE FRONT DESK, [place] back for  a physical exam in 1 month

## 2020-12-16 NOTE — Progress Notes (Signed)
Subjective:    Patient ID: Jerry Sims, male    DOB: [birth year], 55 y.o.   MRN: [id number]  DOS:  12/16/2020 Type of visit - description: acute A week ago,developed right shoulder pain, located anteriorly, worse with certain movements. 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 Denies neck pain per se, no paresthesias. Took ibuprofen this morning with some help.  BP noted to be elevated.   Review of Systems See above   Past Medical History:  Diagnosis Date  . At risk for sleep apnea    STOP-BANG= 4     SENT TO PCP 05-29-2014  . Extensor tenosynovitis of right wrist   . GERD (gastroesophageal reflux disease)   . Hyperlipidemia   . Hypertension   . Wears glasses     Past Surgical History:  Procedure Laterality Date  . CARPAL TUNNEL RELEASE Right 2010  . DORSAL COMPARTMENT RELEASE Right 06/05/2014   Procedure:  RIGHT WRIST FIRST/SECOND DORSAL COMPARTMENT TENOSYNOVECTOMY;  Surgeon: Linna Hoff, MD;  Location: [facility];  Service: Orthopedics;  Laterality: Right;  . NASAL SEPTUM SURGERY  2014    Allergies as of 12/16/2020      Reactions   Lipitor [atorvastatin] Other (See Comments)   Heartburn and HAs      Medication List       Accurate as of December 16, 2020 11:59 PM. If you have any questions, ask your nurse or doctor.        amLODipine 5 MG tablet Commonly known as: NORVASC Take 1 tablet (5 mg total) by mouth daily. Started by: Kathlene November, MD          Objective:   Physical Exam BP (!) 179/99 (BP Location: Left Arm, Patient Position: Sitting, Cuff Size: Large)   Pulse (!) 55   Temp 98.1 F (36.7 C) (Oral)   Ht 5\' 5"  (1.651 m)   Wt 195 lb (88.5 kg)   SpO2 98%   BMI 32.45 kg/m  General:   Well developed, NAD, BMI noted. HEENT:  Normocephalic . Face symmetric, atraumatic Lungs:  CTA B Normal respiratory effort, no intercostal retractions, no accessory muscle use. Heart: RRR,  no murmur.  Lower extremities: no  pretibial edema bilaterally  Normal range of motion bilaterally.  Slightly TTP at the R [facility] joint. Skin: Not pale. Not jaundice Neurologic:  alert & oriented X3.  Speech normal, gait appropriate for age and unassisted Psych--  Cognition and judgment appear intact.  Cooperative with normal attention span and concentration.  Behavior appropriate. No anxious or depressed appearing.      Assessment     Assessment    Hyperglycemia, A1c 6.3 2018  HTN Dyslipidemia LDL 182 ----> 11-2014, intolerant to Lipitor 06-2015  GERD At risk of OSA per stop bang 05-2014 : denied fatigue, snoring to me COVID-19: February 2021, had monoclonal antibody infusion. Diverticulitis: First episode 08-2020.  PLAN Acute R shoulder pain: As described above, probably a AC joint issue, improving with NSAIDs today. 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 Plan: tylenol > ibuprofen, see AVS, sports meds referral HTN, high chol, hyperglicemia: Untreated, does not RTC for ROVs, risk of CAD stroke d d/w pt. For now will rx amlodipine, monotor BPs  and RTC for CPX in 1 month, he stated he will    This visit occurred during the SARS-CoV-2 public health emergency.  Safety protocols were in place, including screening  questions prior to the visit, additional usage of staff PPE, and extensive cleaning of exam room while observing appropriate contact time as indicated for disinfecting solutions.

## 2020-12-17 ENCOUNTER — Encounter: Payer: Self-pay | Admitting: Family Medicine

## 2020-12-17 ENCOUNTER — Ambulatory Visit: Payer: Self-pay

## 2020-12-17 ENCOUNTER — Ambulatory Visit (INDEPENDENT_AMBULATORY_CARE_PROVIDER_SITE_OTHER): Payer: 59 | Admitting: Family Medicine

## 2020-12-17 VITALS — BP 178/102 | Ht 65.0 in | Wt 195.4 lb

## 2020-12-17 DIAGNOSIS — M25511 Pain in right shoulder: Secondary | ICD-10-CM

## 2020-12-17 NOTE — Patient Instructions (Addendum)
Thank you for coming in today.  Call or go to the ER if you develop a large red swollen joint with extreme pain or oozing puss.   Return in 6 weeks. Schedule spanish interpreter.   I've referred you to Physical Therapy.  Let us know if you don't hear from them in one week.  Please use voltaren gel up to 4x daily for pain as needed.    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 Haga los ejercicios exactamente como se lo haya indicado el mdico y gradelos como se lo hayan indicado. Es normal sentir un estiramiento leve, tironeo, opresin o Tree surgeon al [employer] [place]. Detngase de inmediato si siente un dolor repentino o Printmaker. No comience a hacer estos ejercicios hasta que se lo indique el mdico. Ejercicio de elongacin y amplitud de movimiento Este ejercicio calienta los msculos y las articulaciones, y mejora la movilidad y la flexibilidad del hombro. Adems, ayuda a Best boy y la rigidez. Aduccin horizontal pasiva En la aduccin pasiva, se [place] la otra mano para mover el brazo [employer] cuerpo. El brazo lesionado no se mueve por s solo. En este movimiento, el brazo se mueve por delante del cuerpo en el plano horizontal (aduccin horizontal). 1. Sintese o prese, y lleve el brazo izquierdo/derecho con el codo extendido [employer] hombro opuesto, cruzando el pecho. Detngase cuando sienta una elongacin suave atrs del hombro y la parte superior del brazo. ? Mantenga el brazo a la altura del hombro. ? Mantenga el brazo lo ms cerca que pueda de su cuerpo, sin Psychiatrist. 2. Mantenga esta posicin durante __________ segundos. 3. Vuelva lentamente a la posicin inicial. Repita __________ veces. Realice este ejercicio __________ veces al da.   Ejercicios de fortalecimiento Estos ejercicios fortalecen el hombro y le otorgan resistencia. La  resistencia es la capacidad de usar los msculos durante un tiempo prolongado, incluso despus de que se cansen. Rotacin externa, ejercicio isomtrico Este es un ejercicio en el que usted debe presionar el dorso de la mueca contra el marco de una puerta sin mover la articulacin del hombro (isomtrico). 1. Prese o sintese en la entrada de [place], de frente al marco de la puerta. 2. Flexione el codo izquierdo/derecho y Geneticist, molecular de atrs de la mueca contra el marco de la [place]. Solo la parte de atrs de la mueca debe tocar el marco. Mantenga la parte superior del brazo al costado del cuerpo. 3. Presione suavemente la [employer] contra el marco de la [place], como si tratara de empujar el brazo en direccin contraria al abdomen (rotacin externa). Presione tan fuerte como pueda sin Education officer, environmental. ? Evite encoger el hombro mientras presiona con la [place] contra el marco de la [place]. Mantenga los omplatos juntos, llvelos hacia el centro de la espalda. 4. Mantenga esta posicin durante __________ segundos. 5. Afloje lentamente la tensin y relaje los msculos por completo antes de repetir el ejercicio. Repita __________ veces. Realice este ejercicio __________ veces al da. Rotacin interna, ejercicio isomtrico Este es un ejercicio en el que usted debe presionar la palma de la mano contra el marco de una puerta sin mover la articulacin del hombro (isomtrico). 1. Prese o sintese en la entrada de [place], de frente al marco de la puerta. 2. Flexione el codo izquierdo/derecho y Cytogeneticist palma de la mano contra el marco de la [place]. Solo  la palma debe tocar el [place] parte superior del brazo al costado del cuerpo. 3. Presione suavemente la mano contra el marco de la [place], como si tratara de empujar el brazo hacia el abdomen (rotacin interna). Presione tan fuerte como pueda sin Education officer, environmental. ? Evite encoger el hombro mientras presiona con la mano sobre el marco de la  [place]. Mantenga los omplatos juntos, llvelos hacia el centro de la espalda. 4. Mantenga esta posicin durante __________ segundos. 5. Afloje lentamente la tensin y relaje los msculos por completo antes de repetir el ejercicio. Repita __________ veces. Realice este ejercicio __________ veces al da.   Protraccin escapular, en decbito supino 1. Acustese boca arriba sobre una superficie firme (posicin supina). Sostenga una pesa de __________ con la mano izquierda/derecha. 2. Eleve el brazo izquierdo/derecho en el aire, de modo que la mano est directamente por encima de la articulacin del hombro. 3. Empuje con la pesa en el aire de forma que el hombro (escpula) se levante de la superficie sobre la que est recostado. La escpula empujar hacia arriba o hacia adelante (protraccin). No mueva la cabeza, el cuello ni la espalda. 4. Mantenga esta posicin durante __________ segundos. 5. Vuelva lentamente a la posicin inicial. Relaje totalmente los msculos antes de repetir el ejercicio. Repita __________ veces. Realice este ejercicio __________ veces al da.   Retraccin escapular 1. Sintese en una silla estable que no tenga apoyabrazos o pngase de pie. 2. Ate una banda para ejercicios a un objeto estable que est frente a usted, de modo que la banda est a la altura del hombro. 3. Sostenga un extremo de la banda para ejercicios en cada mano. Las palmas deben estar Wendi Maya. 4. Junte los omplatos (retraccin) y [employer] codos ligeramente hacia atrs. No encoja los hombros hacia arriba al hacerlo. 5. Mantenga esta posicin durante __________ segundos. 6. Vuelva lentamente a la posicin inicial. Repita __________ veces. Realice este ejercicio __________ veces al da.   Extensin del hombro 1. Sintese en una silla estable que no tenga apoyabrazos o pngase de pie. 2. Ate una banda para ejercicios a un objeto estable que est frente a usted, de modo que la banda est por encima de la  altura del hombro. 3. Sostenga un extremo de la banda para ejercicios en cada mano. 4. Extienda los codos y [employer] manos a la altura de los hombros. 5. Junte los omplatos y baje las manos hacia los costados de los muslos (extensin). Detngase cuando las manos estn en la misma posicin en ambos costados. No deje que las manos vayan hacia atrs del cuerpo. 6. Mantenga esta posicin durante __________ segundos. 7. Vuelva lentamente a la posicin inicial. Repita __________ veces. Realice este ejercicio __________ veces al da.   Esta informacin no tiene Marine scientist el consejo del mdico. Asegrese de hacerle al mdico cualquier pregunta que tenga. Document Revised: 12/04/2018 Document Reviewed: 12/04/2018 Elsevier Patient Education  [place].
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
## 2020-12-17 NOTE — Assessment & Plan Note (Signed)
Acute R shoulder pain: As described above, probably a AC joint issue, improving with NSAIDs today. 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 Plan: tylenol > ibuprofen, see AVS, sports meds referral HTN, high chol, hyperglicemia: Untreated, does not RTC for ROVs, risk of CAD stroke d d/w pt. For now will rx amlodipine, monotor BPs  and RTC for CPX in 1 month, he stated he will

## 2021-01-07 ENCOUNTER — Ambulatory Visit: Payer: 59 | Attending: Family Medicine | Admitting: Physical Therapy

## 2021-01-25 ENCOUNTER — Other Ambulatory Visit: Payer: Self-pay

## 2021-01-25 ENCOUNTER — Ambulatory Visit (INDEPENDENT_AMBULATORY_CARE_PROVIDER_SITE_OTHER): Payer: 59 | Admitting: Internal Medicine

## 2021-01-25 ENCOUNTER — Encounter: Payer: Self-pay | Admitting: Internal Medicine

## 2021-01-25 VITALS — BP 154/97 | HR 65 | Temp 97.4°F | Ht 65.0 in | Wt 193.6 lb

## 2021-01-25 DIAGNOSIS — Z0000 Encounter for general adult medical examination without abnormal findings: Secondary | ICD-10-CM

## 2021-01-25 DIAGNOSIS — E119 Type 2 diabetes mellitus without complications: Secondary | ICD-10-CM

## 2021-01-25 LAB — CBC WITH DIFFERENTIAL/PLATELET
Basophils Absolute: 0 10*3/uL (ref 0.0–0.1)
Basophils Relative: 0.6 % (ref 0.0–3.0)
Eosinophils Absolute: 0.1 10*3/uL (ref 0.0–0.7)
Eosinophils Relative: 1.8 % (ref 0.0–5.0)
HCT: 42.2 % (ref 39.0–52.0)
Hemoglobin: 14.3 g/dL (ref 13.0–17.0)
Lymphocytes Relative: 38.9 % (ref 12.0–46.0)
Lymphs Abs: 2.5 10*3/uL (ref 0.7–4.0)
MCHC: 33.8 g/dL (ref 30.0–36.0)
MCV: 93.8 fl (ref 78.0–100.0)
Monocytes Absolute: 0.6 10*3/uL (ref 0.1–1.0)
Monocytes Relative: 8.7 % (ref 3.0–12.0)
Neutro Abs: 3.2 10*3/uL (ref 1.4–7.7)
Neutrophils Relative %: 50 % (ref 43.0–77.0)
Platelets: 296 10*3/uL (ref 150.0–400.0)
RBC: 4.5 Mil/uL (ref 4.22–5.81)
RDW: 14.1 % (ref 11.5–15.5)
WBC: 6.5 10*3/uL (ref 4.0–10.5)

## 2021-01-25 LAB — COMPREHENSIVE METABOLIC PANEL
ALT: 30 U/L (ref 0–53)
AST: 21 U/L (ref 0–37)
Albumin: 4.1 g/dL (ref 3.5–5.2)
Alkaline Phosphatase: 108 U/L (ref 39–117)
BUN: 17 mg/dL (ref 6–23)
CO2: 32 mEq/L (ref 19–32)
Calcium: 9.7 mg/dL (ref 8.4–10.5)
Chloride: 104 mEq/L (ref 96–112)
Creatinine, Ser: 0.85 mg/dL (ref 0.40–1.50)
GFR: 97.06 mL/min (ref >60.00)
Glucose, Bld: 100 mg/dL — ABNORMAL HIGH (ref 70–99)
Potassium: 4.7 mEq/L (ref 3.5–5.1)
Sodium: 141 mEq/L (ref 135–145)
Total Bilirubin: 0.3 mg/dL (ref 0.2–1.2)
Total Protein: 7.1 g/dL (ref 6.0–8.3)

## 2021-01-25 LAB — LIPID PANEL
Cholesterol: 240 mg/dL — ABNORMAL HIGH (ref 0–200)
HDL: 45.3 mg/dL (ref >39.00)
LDL Cholesterol: 177 mg/dL — ABNORMAL HIGH (ref 0–99)
NonHDL: 194.44
Total CHOL/HDL Ratio: 5
Triglycerides: 87 mg/dL (ref 0.0–149.0)
VLDL: 17.4 mg/dL (ref 0.0–40.0)

## 2021-01-25 LAB — PSA: PSA: 0.46 ng/mL (ref 0.10–4.00)

## 2021-01-25 LAB — HEMOGLOBIN A1C: Hgb A1c MFr Bld: 6.6 % — ABNORMAL HIGH (ref 4.6–6.5)

## 2021-01-25 NOTE — Patient Instructions (Signed)
Continue amlodipina y tomese la prresion regularlmente: La presion debe ser entre 110/65 y  135/85.  usted tiene DIABETES vaya a la pagina web de [employer] , ellos tienen muy buena informacion en espan~ol    GO TO THE LAB : Get the blood work     [place], [place] Come back for   checkup in 4 months

## 2021-01-25 NOTE — Progress Notes (Signed)
Subjective:    Patient ID: Jerry Sims, male    DOB: 14-Mar-1966, 55 y.o.   MRN: [id number]  DOS:  01/25/2021 Type of visit - description: CPX Since the last office visit he feels well. Ambulatory BPs are "okay", he could not give me any readings. Continued snoring.  BP Readings from Last 3 Encounters:  01/25/21 (!) 154/97  12/17/20 (!) 178/102  12/16/20 (!) 179/99     Review of Systems  Other than above, a 14 point review of systems is negative      Past Medical History:  Diagnosis Date  . At risk for sleep apnea    STOP-BANG= 4     SENT TO PCP 05-29-2014  . Extensor tenosynovitis of right wrist   . GERD (gastroesophageal reflux disease)   . Hyperlipidemia   . Hypertension   . Wears glasses     Past Surgical History:  Procedure Laterality Date  . CARPAL TUNNEL RELEASE Right 2010  . DORSAL COMPARTMENT RELEASE Right 06/05/2014   Procedure:  RIGHT WRIST FIRST/SECOND DORSAL COMPARTMENT TENOSYNOVECTOMY;  Surgeon: Linna Hoff, MD;  [facility];  Service: Orthopedics;  Laterality: Right;  . NASAL SEPTUM SURGERY  2014    Allergies as of 01/25/2021      Reactions   Lipitor [atorvastatin] Other (See Comments)   Heartburn and HAs      Medication List       Accurate as of January 25, 2021 11:59 PM. If you have any questions, ask your nurse or doctor.        amLODipine 5 MG tablet Commonly known as: NORVASC Take 1 tablet (5 mg total) by mouth daily.          Objective:   Physical Exam BP (!) 154/97 (BP Location: Left Arm, Patient Position: Sitting, Cuff Size: Large)   Pulse 65   Temp (!) 97.4 F (36.3 C) (Temporal)   Ht 5\' 5"  (1.651 m)   Wt 193 lb 9.6 oz (87.8 kg)   SpO2 98%   BMI 32.22 kg/m  General: Well developed, NAD, BMI noted Neck: No  thyromegaly  HEENT:  Normocephalic . Face symmetric, atraumatic Lungs:  CTA B Normal respiratory effort, no intercostal retractions, no accessory muscle use. Heart: RRR,  no murmur.   Abdomen:  Not distended, soft, non-tender. No rebound or rigidity. DRE: Declined Lower extremities: no pretibial edema bilaterally  Skin: Exposed areas without rash. Not pale. Not jaundice Neurologic:  alert & oriented X3.  Speech normal, gait appropriate for age and unassisted Strength symmetric and appropriate for age.  Psych: Cognition and judgment appear intact.  Cooperative with normal attention span and concentration.  Behavior appropriate. No anxious or depressed appearing.     Assessment      Assessment    DM A1c 6.5 2019  HTN Dyslipidemia LDL 182 ----> 11-2014, intolerant to Lipitor 06-2015  GERD Snoring, at risk of OSA per stop bang 05-2014 : denied fatigue, snoring to me COVID-19: February 2021, had monoclonal antibody infusion. Diverticulitis: First episode 08-2020.  PLAN Here for CPX DM: A1c was 6.5 ~ 2 years ago, he is aware he has diabetes.  We talk about diet and exercise HTN: Since the last visit he started amlodipine, reports good compliance, BP today slightly elevated at home is "okay".  No change for now reassess on RTC Dyslipidemia: We will check labs, with the past he felt atorvastatin caused heartburn but he is willing to try  a different medication (Pravachol?).  Snoring: Reports normal energy and does not feel sleepy at all. RTC 4 months   This visit occurred during the SARS-CoV-2 public health emergency.  Safety protocols were in place, including screening questions prior to the visit, additional usage of staff PPE, and extensive cleaning of exam room while observing appropriate contact time as indicated for disinfecting solutions.

## 2021-01-26 ENCOUNTER — Encounter: Payer: Self-pay | Admitting: Internal Medicine

## 2021-01-26 NOTE — Assessment & Plan Note (Signed)
--  Td 2011, shingrix, COVID vaccines: declined all vaccines, pro>>cons d/w pt --Prostate cancer screening : declined DRE, check a PSA --CCS : Colonoscopy 07-2018, next 10 years per GI letter --Lifestyle: Discussed -Labs: CMP, FLP, CBC, A1c, PSA.  Okay to call results to the patient or his wife Jerry Sims

## 2021-01-26 NOTE — Assessment & Plan Note (Signed)
Here for CPX DM: A1c was 6.5 ~ 2 years ago, he is aware he has diabetes.  We talk about diet and exercise HTN: Since the last visit he started amlodipine, reports good compliance, BP today slightly elevated at home is "okay".  No change for now reassess on RTC Dyslipidemia: We will check labs, with the past he felt atorvastatin caused heartburn but he is willing to try  a different medication (Pravachol?).   Snoring: Reports normal energy and does not feel sleepy at all. RTC 4 months

## 2021-01-27 ENCOUNTER — Other Ambulatory Visit: Payer: Self-pay

## 2021-01-27 DIAGNOSIS — E785 Hyperlipidemia, unspecified: Secondary | ICD-10-CM

## 2021-01-27 MED ORDER — PRAVASTATIN SODIUM 40 MG PO TABS: 40.0000 mg | ORAL_TABLET | Freq: Every day | ORAL | 0 refills | Status: DC

## 2021-02-01 NOTE — Progress Notes (Deleted)
   I, Wendy Poet, LAT, ATC, am serving as scribe for Dr. Lynne Leader.  Jerry Sims is a 55 y.o. male who presents to [place] at [facility] today for f/u of R shoulder pain.  He was last seen by Dr. Georgina Snell on 12/17/20 and had a R shoulder subacromial injection and was referred to PT at the [facility] location.  Since his last visit, pt reports    Pertinent review of systems: ***  Relevant historical information: ***   Exam:  There were no vitals taken for this visit. General: Well Developed, well nourished, and in no acute distress.   MSK: ***    Lab and Radiology Results No results found for this or any previous visit (from the past 72 hour(s)). No results found.     Assessment and Plan: 55 y.o. male with ***   PDMP not reviewed this encounter. No orders of the defined types were placed in this encounter.  No orders of the defined types were placed in this encounter.    Discussed warning signs or symptoms. Please see discharge instructions. Patient expresses understanding.   ***

## 2021-02-02 ENCOUNTER — Ambulatory Visit: Payer: 59 | Admitting: Family Medicine

## 2021-03-29 ENCOUNTER — Other Ambulatory Visit: Payer: 59

## 2021-04-02 ENCOUNTER — Other Ambulatory Visit (INDEPENDENT_AMBULATORY_CARE_PROVIDER_SITE_OTHER): Payer: 59

## 2021-04-02 ENCOUNTER — Other Ambulatory Visit: Payer: Self-pay

## 2021-04-02 DIAGNOSIS — E785 Hyperlipidemia, unspecified: Secondary | ICD-10-CM | POA: Diagnosis not present

## 2021-04-02 LAB — AST: AST: 27 U/L (ref 0–37)

## 2021-04-02 LAB — LIPID PANEL
Cholesterol: 231 mg/dL — ABNORMAL HIGH (ref 0–200)
HDL: 43.4 mg/dL (ref >39.00)
LDL Cholesterol: 155 mg/dL — ABNORMAL HIGH (ref 0–99)
NonHDL: 187.78
Total CHOL/HDL Ratio: 5
Triglycerides: 166 mg/dL — ABNORMAL HIGH (ref 0.0–149.0)
VLDL: 33.2 mg/dL (ref 0.0–40.0)

## 2021-04-02 LAB — ALT: ALT: 46 U/L (ref 0–53)

## 2021-04-06 MED ORDER — PRAVASTATIN SODIUM 40 MG PO TABS: 40.0000 mg | ORAL_TABLET | Freq: Every day | ORAL | 0 refills | Status: DC

## 2021-04-06 NOTE — Addendum Note (Signed)
Addended byDamita Dunnings D on: 04/06/2021 01:16 PM   Modules accepted: Orders

## 2021-07-01 ENCOUNTER — Encounter: Payer: Self-pay | Admitting: Internal Medicine

## 2021-08-26 ENCOUNTER — Emergency Department (HOSPITAL_BASED_OUTPATIENT_CLINIC_OR_DEPARTMENT_OTHER): Payer: 59

## 2021-08-26 ENCOUNTER — Encounter (HOSPITAL_BASED_OUTPATIENT_CLINIC_OR_DEPARTMENT_OTHER): Payer: Self-pay | Admitting: *Deleted

## 2021-08-26 ENCOUNTER — Other Ambulatory Visit: Payer: Self-pay

## 2021-08-26 ENCOUNTER — Emergency Department (HOSPITAL_BASED_OUTPATIENT_CLINIC_OR_DEPARTMENT_OTHER)
Admission: EM | Admit: 2021-08-26 | Discharge: 2021-08-26 | Disposition: A | Discharge status: Home / Self Care | Payer: 59 | Attending: Emergency Medicine | Admitting: Emergency Medicine

## 2021-08-26 DIAGNOSIS — X58XXXA Exposure to other specified factors, initial encounter: Secondary | ICD-10-CM | POA: Diagnosis not present

## 2021-08-26 DIAGNOSIS — S61011A Laceration without foreign body of right thumb without damage to nail, initial encounter: Secondary | ICD-10-CM | POA: Insufficient documentation

## 2021-08-26 DIAGNOSIS — I10 Essential (primary) hypertension: Secondary | ICD-10-CM | POA: Insufficient documentation

## 2021-08-26 DIAGNOSIS — S6991XA Unspecified injury of right wrist, hand and finger(s), initial encounter: Secondary | ICD-10-CM | POA: Diagnosis present

## 2021-08-26 DIAGNOSIS — Z79899 Other long term (current) drug therapy: Secondary | ICD-10-CM | POA: Diagnosis not present

## 2021-08-26 MED ORDER — IBUPROFEN 800 MG PO TABS
800.0000 mg | ORAL_TABLET | Freq: Once | ORAL | Status: AC
  Administered 2021-08-26: 800 mg via ORAL
  Filled 2021-08-26: qty 1

## 2021-08-26 MED ORDER — DOXYCYCLINE HYCLATE 100 MG PO CAPS: 100.0000 mg | ORAL_CAPSULE | Freq: Two times a day (BID) | ORAL | 0 refills | Status: DC

## 2021-08-26 MED ORDER — DOXYCYCLINE HYCLATE 100 MG PO TABS
100.0000 mg | ORAL_TABLET | Freq: Once | ORAL | Status: AC
  Administered 2021-08-26: 100 mg via ORAL
  Filled 2021-08-26: qty 1

## 2021-08-26 NOTE — Discharge Instructions (Signed)
Return for redness, fever.  Call the hand surgeon tomorrow and see if they want to see you in the office.

## 2021-08-26 NOTE — ED Provider Notes (Signed)
[facility] EMERGENCY DEPARTMENT Provider Note   CSN: [id number] Arrival date & time: 08/26/21  2119     History Chief Complaint  Patient presents with   Extremity Laceration    Jerry Sims is a 55 y.o. male.  55 yo M with a cc of R thumb pain.  Suffered a laceration to the base of the R thumb, left forehead.  Seen in clinic in [place] staples place in scalp.    The history is provided by the patient.  Illness Severity:  Moderate Onset quality:  Gradual Duration:  9 days Timing:  Constant Progression:  Worsening Chronicity:  New Associated symptoms: no abdominal pain, no chest pain, no congestion, no diarrhea, no fever, no headaches, no myalgias, no rash, no shortness of breath and no vomiting       Past Medical History:  Diagnosis Date   At risk for sleep apnea    STOP-BANG= 4     SENT TO PCP 05-29-2014   Extensor tenosynovitis of right wrist    GERD (gastroesophageal reflux disease)    Hyperlipidemia    Hypertension    Wears glasses     Patient Active Problem List   Diagnosis Date Noted   Diverticulitis, first episode 08-2020 09/05/2020   Hyperlipidemia 01/05/2017   PCP NOTES >>>>>>>>>>>>. 07/09/2015   Annual physical exam 11/26/2014   Hypertension 11/26/2014   LEG PAIN, BILATERAL 12/09/2008   PARESTHESIA 12/09/2008   ESOPHAGEAL REFLUX 05/14/2008    Past Surgical History:  Procedure Laterality Date   CARPAL TUNNEL RELEASE Right 2010   DORSAL COMPARTMENT RELEASE Right 06/05/2014   Procedure:  RIGHT WRIST FIRST/SECOND DORSAL COMPARTMENT TENOSYNOVECTOMY;  Surgeon: Linna Hoff, MD;  Location: [facility];  Service: Orthopedics;  Laterality: Right;   NASAL SEPTUM SURGERY  2014       Family History  Problem Relation Age of Onset   Diabetes Other        GM, uncle    Colon cancer Neg Hx    Prostate cancer Neg Hx    CAD Neg Hx    Colon polyps Neg Hx    Esophageal cancer Neg Hx    Stomach cancer Neg Hx    Rectal cancer  Neg Hx     Social History   Tobacco Use   Smoking status: Never   Smokeless tobacco: Never  Substance Use Topics   Alcohol use: Yes    Comment: OCCASIONAL   Drug use: No    Home Medications Prior to Admission medications   Medication Sig Start Date End Date Taking? Authorizing Provider  doxycycline (VIBRAMYCIN) 100 MG capsule Take 1 capsule (100 mg total) by mouth 2 (two) times daily. One po bid x 7 days 08/26/21  Yes Deno Etienne, DO  amLODipine (NORVASC) 5 MG tablet Take 1 tablet (5 mg total) by mouth daily. 12/16/20   Colon Branch, MD  pravastatin (PRAVACHOL) 40 MG tablet Take 1 tablet (40 mg total) by mouth daily. 04/06/21   Colon Branch, MD    Allergies    Lipitor [atorvastatin]  Review of Systems   Review of Systems  Constitutional:  Negative for chills and fever.  HENT:  Negative for congestion and facial swelling.   Eyes:  Negative for discharge and visual disturbance.  Respiratory:  Negative for shortness of breath.   Cardiovascular:  Negative for chest pain and palpitations.  Gastrointestinal:  Negative for abdominal pain, diarrhea and vomiting.  Musculoskeletal:  Negative for arthralgias and myalgias.  Skin:  Positive for wound. Negative for color change and rash.  Neurological:  Negative for tremors, syncope and headaches.  Psychiatric/Behavioral:  Negative for confusion and dysphoric mood.    Physical Exam Updated Vital Signs BP (!) 168/104 (BP Location: Right Arm)   Pulse 87   Temp 99 F (37.2 C) (Oral)   Resp 18   Ht 5\' 6"  (1.676 m)   Wt 81.6 kg   SpO2 98%   BMI 29.05 kg/m   Physical Exam Vitals and nursing note reviewed.  Constitutional:      Appearance: He is well-developed.  HENT:     Head: Normocephalic and atraumatic.  Eyes:     Pupils: Pupils are equal, round, and reactive to light.  Neck:     Vascular: No JVD.  Cardiovascular:     Rate and Rhythm: Normal rate and regular rhythm.     Heart sounds: No murmur heard.   No friction rub. No  gallop.  Pulmonary:     Effort: No respiratory distress.     Breath sounds: No wheezing.  Abdominal:     General: There is no distension.     Tenderness: There is no abdominal tenderness. There is no guarding or rebound.  Musculoskeletal:        General: Normal range of motion.     Cervical back: Normal range of motion and neck supple.     Comments: Wound to the base of the finger whitish drainage, limited extension of thumb.  No fluctuance, no induration.   Skin:    Coloration: Skin is not pale.     Findings: No rash.  Neurological:     Mental Status: He is alert and oriented to person, place, and time.  Psychiatric:        Behavior: Behavior normal.    ED Results / Procedures / Treatments   Labs (all labs ordered are listed, but only abnormal results are displayed) Labs Reviewed - No data to display  EKG None  Radiology DG Hand Complete Right  Result Date: 08/26/2021 CLINICAL DATA:  Laceration of the right thumb. EXAM: RIGHT HAND - COMPLETE 3+ VIEW COMPARISON:  Right hand radiograph dated 04/02/2011. FINDINGS: No acute fracture or dislocation. The bones are well mineralized. No significant arthritic change. The soft tissues are unremarkable IMPRESSION: Negative. Electronically Signed   By: Anner Crete M.D.   On: 08/26/2021 21:51    Procedures Procedures   Medications Ordered in ED Medications  doxycycline (VIBRA-TABS) tablet 100 mg (100 mg Oral Given 08/26/21 2151)    ED Course  I have reviewed the triage vital signs and the nursing notes.  Pertinent labs & imaging results that were available during my care of the patient were reviewed by me and considered in my medical decision making (see chart for details).    MDM Rules/Calculators/A&P                           55 yo M with a cc of R thumb pain.  Lac about 9 days ago.  Started having drainage yesterday.  No systemically ill.  Will obtain plain film, abx.  Hand follow up.   9:57 PM:  I have discussed the  diagnosis/risks/treatment options with the patient and believe the pt to be eligible for discharge home to follow-up with Hand. We also discussed returning to the ED immediately if new or worsening sx occur. We discussed the sx which are most concerning (e.g., sudden  worsening pain, fever, inability to tolerate by mouth) that necessitate immediate return. Medications administered to the patient during their visit and any new prescriptions provided to the patient are listed below.  Medications given during this visit Medications  doxycycline (VIBRA-TABS) tablet 100 mg (100 mg Oral Given 08/26/21 2151)     The patient appears reasonably screen and/or stabilized for discharge and I doubt any other medical condition or other [facility] requiring further screening, evaluation, or treatment in the ED at this time prior to discharge.    Final Clinical Impression(s) / ED Diagnoses Final diagnoses:  Laceration of right thumb without damage to nail, foreign body presence unspecified, initial encounter    Rx / DC Orders ED Discharge Orders          Ordered    doxycycline (VIBRAMYCIN) 100 MG capsule  2 times daily        08/26/21 2155             Deno Etienne, DO 08/26/21 2157

## 2021-08-26 NOTE — ED Triage Notes (Signed)
Laceration to right thumb 9 days ago.  Swelling, yellow/white drainage, redness noted.

## 2021-08-30 ENCOUNTER — Other Ambulatory Visit: Payer: Self-pay | Admitting: Orthopedic Surgery

## 2021-08-31 ENCOUNTER — Ambulatory Visit (HOSPITAL_BASED_OUTPATIENT_CLINIC_OR_DEPARTMENT_OTHER): Payer: 59 | Admitting: Certified Registered"

## 2021-08-31 ENCOUNTER — Other Ambulatory Visit: Payer: Self-pay

## 2021-08-31 ENCOUNTER — Encounter (HOSPITAL_BASED_OUTPATIENT_CLINIC_OR_DEPARTMENT_OTHER): Payer: Self-pay | Admitting: Orthopedic Surgery

## 2021-08-31 ENCOUNTER — Ambulatory Visit (HOSPITAL_BASED_OUTPATIENT_CLINIC_OR_DEPARTMENT_OTHER)
Admission: RE | Admit: 2021-08-31 | Discharge: 2021-08-31 | Disposition: A | Discharge status: Home / Self Care | Payer: 59 | Attending: Orthopedic Surgery | Admitting: Orthopedic Surgery

## 2021-08-31 ENCOUNTER — Encounter (HOSPITAL_BASED_OUTPATIENT_CLINIC_OR_DEPARTMENT_OTHER)
Admission: RE | Disposition: A | Discharge status: Home / Self Care | Payer: Self-pay | Source: Home / Self Care | Attending: Orthopedic Surgery

## 2021-08-31 DIAGNOSIS — S61011A Laceration without foreign body of right thumb without damage to nail, initial encounter: Secondary | ICD-10-CM | POA: Insufficient documentation

## 2021-08-31 DIAGNOSIS — X58XXXA Exposure to other specified factors, initial encounter: Secondary | ICD-10-CM | POA: Insufficient documentation

## 2021-08-31 HISTORY — PX: INCISION AND DRAINAGE: SHX5863

## 2021-08-31 SURGERY — INCISION AND DRAINAGE
Anesthesia: General | Site: Hand | Laterality: Right

## 2021-08-31 MED ORDER — LACTATED RINGERS IV SOLN: INTRAVENOUS | Status: DC

## 2021-08-31 MED ORDER — PROPOFOL 10 MG/ML IV BOLUS
INTRAVENOUS | Status: AC
  Filled 2021-08-31: qty 20

## 2021-08-31 MED ORDER — PROPOFOL 10 MG/ML IV BOLUS
INTRAVENOUS | Status: DC | PRN
  Administered 2021-08-31: 180 mg via INTRAVENOUS

## 2021-08-31 MED ORDER — ONDANSETRON HCL 4 MG/2ML IJ SOLN
INTRAMUSCULAR | Status: AC
  Filled 2021-08-31: qty 2

## 2021-08-31 MED ORDER — ACETAMINOPHEN 10 MG/ML IV SOLN: 1000.0000 mg | Freq: Once | INTRAVENOUS | Status: DC | PRN

## 2021-08-31 MED ORDER — ONDANSETRON HCL 4 MG/2ML IJ SOLN
INTRAMUSCULAR | Status: DC | PRN
  Administered 2021-08-31: 4 mg via INTRAVENOUS

## 2021-08-31 MED ORDER — BUPIVACAINE HCL (PF) 0.25 % IJ SOLN
INTRAMUSCULAR | Status: AC
  Filled 2021-08-31: qty 30

## 2021-08-31 MED ORDER — DEXAMETHASONE SODIUM PHOSPHATE 10 MG/ML IJ SOLN
INTRAMUSCULAR | Status: DC | PRN
Start: 2021-08-31 — End: 2021-08-31
  Administered 2021-08-31: 5 mg via INTRAVENOUS

## 2021-08-31 MED ORDER — MIDAZOLAM HCL 2 MG/2ML IJ SOLN
INTRAMUSCULAR | Status: AC
  Filled 2021-08-31: qty 2

## 2021-08-31 MED ORDER — PROPOFOL 500 MG/50ML IV EMUL
INTRAVENOUS | Status: AC
  Filled 2021-08-31: qty 50

## 2021-08-31 MED ORDER — FENTANYL CITRATE (PF) 100 MCG/2ML IJ SOLN
INTRAMUSCULAR | Status: DC | PRN
  Administered 2021-08-31 (×2): 50 ug via INTRAVENOUS

## 2021-08-31 MED ORDER — LACTATED RINGERS IV SOLN: INTRAVENOUS | Status: DC | PRN

## 2021-08-31 MED ORDER — OXYCODONE HCL 5 MG PO TABS
5.0000 mg | ORAL_TABLET | Freq: Once | ORAL | Status: AC | PRN
  Administered 2021-08-31: 5 mg via ORAL

## 2021-08-31 MED ORDER — AMISULPRIDE (ANTIEMETIC) 5 MG/2ML IV SOLN: 10.0000 mg | Freq: Once | INTRAVENOUS | Status: DC | PRN

## 2021-08-31 MED ORDER — BUPIVACAINE-EPINEPHRINE (PF) 0.25% -1:200000 IJ SOLN
INTRAMUSCULAR | Status: AC
  Filled 2021-08-31: qty 30

## 2021-08-31 MED ORDER — CEFAZOLIN SODIUM-DEXTROSE 2-3 GM-%(50ML) IV SOLR
INTRAVENOUS | Status: DC | PRN
  Administered 2021-08-31: 2 g via INTRAVENOUS

## 2021-08-31 MED ORDER — BUPIVACAINE HCL (PF) 0.25 % IJ SOLN
INTRAMUSCULAR | Status: DC | PRN
  Administered 2021-08-31: 7 mL

## 2021-08-31 MED ORDER — OXYCODONE HCL 5 MG PO TABS
ORAL_TABLET | ORAL | Status: AC
  Filled 2021-08-31: qty 1

## 2021-08-31 MED ORDER — MIDAZOLAM HCL 2 MG/2ML IJ SOLN
INTRAMUSCULAR | Status: DC | PRN
  Administered 2021-08-31: 2 mg via INTRAVENOUS

## 2021-08-31 MED ORDER — DOXYCYCLINE HYCLATE 50 MG PO CAPS: 100.0000 mg | ORAL_CAPSULE | Freq: Two times a day (BID) | ORAL | 0 refills | Status: DC

## 2021-08-31 MED ORDER — FENTANYL CITRATE (PF) 100 MCG/2ML IJ SOLN
INTRAMUSCULAR | Status: AC
  Filled 2021-08-31: qty 2

## 2021-08-31 MED ORDER — ONDANSETRON HCL 4 MG/2ML IJ SOLN: 4.0000 mg | Freq: Once | INTRAMUSCULAR | Status: DC | PRN

## 2021-08-31 MED ORDER — HYDROMORPHONE HCL 1 MG/ML IJ SOLN: 0.2500 mg | INTRAMUSCULAR | Status: DC | PRN

## 2021-08-31 MED ORDER — HYDROCODONE-ACETAMINOPHEN 5-325 MG PO TABS: ORAL_TABLET | ORAL | 0 refills | Status: DC

## 2021-08-31 MED ORDER — OXYCODONE HCL 5 MG/5ML PO SOLN: 5.0000 mg | Freq: Once | ORAL | Status: AC | PRN

## 2021-08-31 SURGICAL SUPPLY — 52 items
APL PRP STRL LF DISP 70% ISPRP (MISCELLANEOUS) ×1
BAG DECANTER FOR FLEXI CONT (MISCELLANEOUS) IMPLANT
BLADE MINI RND TIP GREEN BEAV (BLADE) IMPLANT
BLADE SURG 15 STRL LF DISP TIS (BLADE) ×2 IMPLANT
BLADE SURG 15 STRL SS (BLADE) ×6
BNDG CMPR 9X4 STRL LF SNTH (GAUZE/BANDAGES/DRESSINGS) ×1
BNDG COHESIVE 1X5 TAN STRL LF (GAUZE/BANDAGES/DRESSINGS) IMPLANT
BNDG ELASTIC 2X5.8 VLCR STR LF (GAUZE/BANDAGES/DRESSINGS) IMPLANT
BNDG ELASTIC 3X5.8 VLCR STR LF (GAUZE/BANDAGES/DRESSINGS) ×2 IMPLANT
BNDG ESMARK 4X9 LF (GAUZE/BANDAGES/DRESSINGS) ×2 IMPLANT
BNDG GAUZE 1X2.1 STRL (MISCELLANEOUS) IMPLANT
BNDG GAUZE ELAST 4 BULKY (GAUZE/BANDAGES/DRESSINGS) ×2 IMPLANT
CHLORAPREP W/TINT 26 (MISCELLANEOUS) ×3 IMPLANT
CORD BIPOLAR FORCEPS 12FT (ELECTRODE) ×3 IMPLANT
COVER BACK TABLE 60X90IN (DRAPES) ×3 IMPLANT
COVER MAYO STAND STRL (DRAPES) ×3 IMPLANT
CUFF TOURN SGL QUICK 18X4 (TOURNIQUET CUFF) ×3 IMPLANT
DRAPE EXTREMITY T 121X128X90 (DISPOSABLE) ×3 IMPLANT
DRAPE SURG 17X23 STRL (DRAPES) ×3 IMPLANT
GAUZE PACKING IODOFORM 1/4X15 (PACKING) ×2 IMPLANT
GAUZE SPONGE 4X4 12PLY STRL (GAUZE/BANDAGES/DRESSINGS) ×3 IMPLANT
GAUZE XEROFORM 1X8 LF (GAUZE/BANDAGES/DRESSINGS) ×3 IMPLANT
GLOVE SRG 8 PF TXTR STRL LF DI (GLOVE) ×1 IMPLANT
GLOVE SURG ENC MOIS LTX SZ7.5 (GLOVE) ×3 IMPLANT
GLOVE SURG UNDER POLY LF SZ8 (GLOVE) ×3
GOWN STRL REUS W/ TWL LRG LVL3 (GOWN DISPOSABLE) ×1 IMPLANT
GOWN STRL REUS W/TWL LRG LVL3 (GOWN DISPOSABLE) ×3
GOWN STRL REUS W/TWL XL LVL3 (GOWN DISPOSABLE) ×3 IMPLANT
LOOP VESSEL MAXI BLUE (MISCELLANEOUS) IMPLANT
NDL BLUNT 17GA (NEEDLE) IMPLANT
NDL HYPO 25X1 1.5 SAFETY (NEEDLE) IMPLANT
NEEDLE BLUNT 17GA (NEEDLE) IMPLANT
NEEDLE HYPO 25X1 1.5 SAFETY (NEEDLE) ×3 IMPLANT
NS IRRIG 1000ML POUR BTL (IV SOLUTION) ×3 IMPLANT
PACK BASIN DAY SURGERY FS (CUSTOM PROCEDURE TRAY) ×3 IMPLANT
PAD CAST 3X4 CTTN HI CHSV (CAST SUPPLIES) IMPLANT
PADDING CAST ABS 4INX4YD NS (CAST SUPPLIES) ×2
PADDING CAST ABS COTTON 4X4 ST (CAST SUPPLIES) ×1 IMPLANT
PADDING CAST COTTON 3X4 STRL (CAST SUPPLIES) ×3
SPLINT PLASTER CAST XFAST 3X15 (CAST SUPPLIES) IMPLANT
SPLINT PLASTER XTRA FASTSET 3X (CAST SUPPLIES) ×20
STOCKINETTE 4X48 STRL (DRAPES) ×3 IMPLANT
SUT ETHILON 4 0 PS 2 18 (SUTURE) IMPLANT
SWAB COLLECTION DEVICE MRSA (MISCELLANEOUS) ×2 IMPLANT
SWAB CULTURE ESWAB REG 1ML (MISCELLANEOUS) ×2 IMPLANT
SYR 20ML LL LF (SYRINGE) IMPLANT
SYR BULB EAR ULCER 3OZ GRN STR (SYRINGE) ×3 IMPLANT
SYR CONTROL 10ML LL (SYRINGE) ×2 IMPLANT
SYR TOOMEY 50ML (SYRINGE) IMPLANT
TOWEL GREEN STERILE FF (TOWEL DISPOSABLE) ×6 IMPLANT
TUBE FEEDING ENTERAL 5FR 16IN (TUBING) IMPLANT
UNDERPAD 30X36 HEAVY ABSORB (UNDERPADS AND DIAPERS) ×3 IMPLANT

## 2021-08-31 NOTE — Anesthesia Postprocedure Evaluation (Signed)
Anesthesia Post Note  Patient: Jerry Sims  Procedure(s) Performed: INCISION AND DRAINAGE RIGHT THUMB (Right: Hand)     Patient location during evaluation: PACU Anesthesia Type: General Level of consciousness: awake and alert Pain management: pain level controlled Vital Signs Assessment: post-procedure vital signs reviewed and stable Respiratory status: spontaneous breathing, nonlabored ventilation, respiratory function stable and patient connected to nasal cannula oxygen Cardiovascular status: blood pressure returned to baseline and stable Postop Assessment: no apparent nausea or vomiting Anesthetic complications: no   No notable events documented.  Last Vitals:  Vitals:   08/31/21 1345 08/31/21 1400  BP: (!) 147/97 (!) 145/105  Pulse: 60 60  Resp: 15   Temp:    SpO2: 97% 95%    Last Pain:  Vitals:   08/31/21 1400  TempSrc:   PainSc: 3                  Barnet Glasgow

## 2021-08-31 NOTE — Op Note (Addendum)
NAME: Jerry Sims MEDICAL RECORD NO: [id number] DATE OF BIRTH: Feb 22, 1966 FACILITY: [facility] LOCATION: [facility] PHYSICIAN: Tennis Must, MD   OPERATIVE REPORT   DATE OF PROCEDURE: 08/31/21    PREOPERATIVE DIAGNOSIS: Right thumb laceration with infection   POSTOPERATIVE DIAGNOSIS: Right thumb laceration with infection including MP joint with EPL and EPB tendon laceration   PROCEDURE: Right thumb incision and drainage including MP joint   SURGEON:  Leanora Cover, M.D.   ASSISTANT: none   ANESTHESIA:  General   INTRAVENOUS FLUIDS:  Per anesthesia flow sheet.   ESTIMATED BLOOD LOSS:  Minimal.   COMPLICATIONS:  None.   SPECIMENS: Cultures to micro   TOURNIQUET TIME:    Total Tourniquet Time Documented: Upper Arm (Right) - 18 minutes Total: Upper Arm (Right) - 18 minutes    DISPOSITION:  Stable to PACU.   INDICATIONS: 55 year old male states he sustained laceration to his right thumb approximately 2 weeks ago.  Over the past several days he has had increasing swelling pain and erythema.  He was seen at the emergency department where he was started on antibiotics.  The swelling decreased.  He continues to have erythema around the wound.  He wishes to proceed with incision and drainage.  Risks, benefits and alternatives of surgery were discussed including the risks of blood loss, infection, damage to nerves, vessels, tendons, ligaments, bone for surgery, need for additional surgery, complications with wound healing, continued pain, stiffness, , need for repeat irrigation and debridement.  He voiced understanding of these risks and elected to proceed.  OPERATIVE COURSE:  After being identified preoperatively by myself,  the patient and I agreed on the procedure and site of the procedure.  The surgical site was marked.  Surgical consent had been signed. He was given IV antibiotics as preoperative antibiotic prophylaxis. He was transferred to the operating room and  placed on the operating table in supine position with the Right upper extremity on an arm board.  General anesthesia was induced by the anesthesiologist.  Right upper extremity was prepped and draped in normal sterile orthopedic fashion.  A surgical pause was performed between the surgeons, anesthesia, and operating room staff and all were in agreement as to the patient, procedure, and site of procedure.  Tourniquet at the proximal aspect of the extremity was inflated to 250 mmHg after exsanguination of the arm with an Esmarch bandage.  The wound was opened.  There was no gross purulence.  The wound tracked down toward the MP joint.  There was laceration of the EPL and EPB tendons.  The wound was extended proximally and distally to aid in drainage and visualization.  At the ulnar side of the wound the wound entered the MP joint.  There is no gross purulence within the joint.  The wound was debrided sharply using the scissors to remove any devitalized tissue.  Cultures were taken for aerobes and anaerobes.  The wound and MP joint were then copiously irrigated with sterile saline.  Quarter-inch iodoform gauze was used to pack the wound including a wick into the MP joint.  The wound was injected with quarter percent plain Marcaine to aid in postoperative analgesia.  It was dressed with sterile 4 x 4's and wrapped with a Kerlix bandage.  A thumb spica splint was placed and wrapped with Kerlix and Ace bandage.  The tourniquet was deflated at 18 minutes.  Fingertips were pink with brisk capillary refill after deflation of tourniquet.  The operative  drapes were broken down.  The patient was awoken from anesthesia safely.  He was transferred back to the stretcher and taken to PACU in stable condition.  I will see him back in the office in 3-4 days for postoperative followup.  I will give him a prescription for Norco 5/325 1-2 tabs PO q6 hours prn pain, dispense # 20 and a refill of doxycycline 100 mg p.o. twice daily x7  days was sent . Debridement type: Excisional Debridement  Side: right  Body Location: thumb   Tools used for debridement: scissors  Debridement depth beyond dead/damaged tissue down to healthy viable tissue: yes  Tissue layer involved: skin, subcutaneous tissue, muscle / fascia  Nature of tissue removed: Slough and Devitalized Tissue  Irrigation volume: 250 cc     Irrigation fluid type: Normal Saline    Leanora Cover, MD Electronically signed, 08/31/21

## 2021-08-31 NOTE — Discharge Instructions (Addendum)

## 2021-08-31 NOTE — Anesthesia Procedure Notes (Signed)
Procedure Name: LMA Insertion Date/Time: 08/31/2021 1:01 PM Performed by: Verita Lamb, CRNA Pre-anesthesia Checklist: Patient identified, Emergency Drugs available, Suction available and Patient being monitored Patient Re-evaluated:Patient Re-evaluated prior to induction Oxygen Delivery Method: Circle system utilized Preoxygenation: Pre-oxygenation with 100% oxygen Induction Type: IV induction Ventilation: Mask ventilation without difficulty LMA: LMA inserted LMA Size: 4.0 Number of attempts: 1 Airway Equipment and Method: Bite block Placement Confirmation: positive ETCO2 Tube secured with: Tape Dental Injury: Teeth and Oropharynx as per pre-operative assessment

## 2021-08-31 NOTE — H&P (Signed)
Jerry Sims is an 55 y.o. male.   Chief Complaint: wound infection HPI: 55 yo male states he sustained laceration to right thumb 2 weeks ago.  Over past several days has had worsening swelling and erythema.  Drainage from wound.  Started on antibiotics by ED.  No fevers or chills.  Allergies:  Allergies  Allergen Reactions   Lipitor [Atorvastatin] Other (See Comments)    Heartburn and HAs    Past Medical History:  Diagnosis Date   At risk for sleep apnea    STOP-BANG= 4     SENT TO PCP 05-29-2014   Extensor tenosynovitis of right wrist    GERD (gastroesophageal reflux disease)    Hyperlipidemia    Hypertension    Wears glasses     Past Surgical History:  Procedure Laterality Date   CARPAL TUNNEL RELEASE Right 2010   DORSAL COMPARTMENT RELEASE Right 06/05/2014   Procedure:  RIGHT WRIST FIRST/SECOND DORSAL COMPARTMENT TENOSYNOVECTOMY;  Surgeon: Linna Hoff, MD;  Location: [facility];  Service: Orthopedics;  Laterality: Right;   NASAL SEPTUM SURGERY  2014    Family History: Family History  Problem Relation Age of Onset   Diabetes Other        GM, uncle    Colon cancer Neg Hx    Prostate cancer Neg Hx    CAD Neg Hx    Colon polyps Neg Hx    Esophageal cancer Neg Hx    Stomach cancer Neg Hx    Rectal cancer Neg Hx     Social History:   reports that he has never smoked. He has never used smokeless tobacco. He reports current alcohol use. He reports that he does not use drugs.  Medications: Medications Prior to Admission  Medication Sig Dispense Refill   amLODipine (NORVASC) 5 MG tablet Take 1 tablet (5 mg total) by mouth daily. 90 tablet 1   doxycycline (VIBRAMYCIN) 100 MG capsule Take 1 capsule (100 mg total) by mouth 2 (two) times daily. One po bid x 7 days 14 capsule 0   pravastatin (PRAVACHOL) 40 MG tablet Take 1 tablet (40 mg total) by mouth daily. 90 tablet 0    No results found for this or any previous visit (from the past 48  hour(s)).  No results found.    Blood pressure (!) 161/99, pulse 64, temperature 98.4 F (36.9 C), temperature source Oral, resp. rate 18, height 5\' 6"  (1.676 m), weight 88 kg, SpO2 100 %.  General appearance: alert, cooperative, and appears stated age Head: Normocephalic, without obvious abnormality, atraumatic Neck: supple, symmetrical, trachea midline Cardio: regular rate and rhythm Resp: clear to auscultation bilaterally Extremities: Intact sensation and capillary refill all digits.  +epl/fpl/io.  Wound on dorsum right thumb just distal to mp joint.  Erythema surrounding.  No proximal streaking. Pulses: 2+ and symmetric Skin: Skin color, texture, turgor normal. No rashes or lesions Neurologic: Grossly normal Incision/Wound: as above  Assessment/Plan Right thumb wound infection.  Recommend incision and drainage possibly including mp joint.  Non operative and operative treatment options have been discussed with the patient and patient wishes to proceed with operative treatment. Risks, benefits, and alternatives of surgery have been discussed and the patient agrees with the plan of care.   Leanora Cover 08/31/2021, 12:14 PM

## 2021-08-31 NOTE — Anesthesia Preprocedure Evaluation (Signed)
Anesthesia Evaluation  Patient identified by MRN, date of birth, ID band Patient awake    Reviewed: Allergy & Precautions, Patient's Chart, lab work & pertinent test results  Airway Mallampati: II  TM Distance: >3 FB Neck ROM: Full    Dental no notable dental hx. (+) Teeth Intact, Dental Advisory Given   Pulmonary neg pulmonary ROS,    Pulmonary exam normal breath sounds clear to auscultation       Cardiovascular hypertension, Pt. on medications Normal cardiovascular exam Rhythm:Regular Rate:Normal     Neuro/Psych negative neurological ROS  negative psych ROS   GI/Hepatic Neg liver ROS,   Endo/Other  diabetes  Renal/GU negative Renal ROS  negative genitourinary   Musculoskeletal negative musculoskeletal ROS (+)   Abdominal   Peds negative pediatric ROS (+)  Hematology negative hematology ROS (+)   Anesthesia Other Findings ALL lipitor  Reproductive/Obstetrics negative OB ROS                            Anesthesia Physical Anesthesia Plan  ASA: 2  Anesthesia Plan: General   Post-op Pain Management:    Induction: Intravenous  PONV Risk Score and Plan: 3 and Treatment may vary due to age or medical condition and Ondansetron  Airway Management Planned: LMA  Additional Equipment: None  Intra-op Plan:   Post-operative Plan:   Informed Consent: I have reviewed the patients History and Physical, chart, labs and discussed the procedure including the risks, benefits and alternatives for the proposed anesthesia with the patient or authorized representative who has indicated his/her understanding and acceptance.     Dental advisory given and Interpreter used for interveiw  Plan Discussed with: CRNA and Anesthesiologist  Anesthesia Plan Comments:         Anesthesia Quick Evaluation

## 2021-08-31 NOTE — Transfer of Care (Signed)
Immediate Anesthesia Transfer of Care Note  Patient: Jerry Sims  Procedure(s) Performed: INCISION AND DRAINAGE RIGHT THUMB (Right: Hand)  Patient Location: PACU  Anesthesia Type:General  Level of Consciousness: awake, alert  and oriented  Airway & Oxygen Therapy: Patient Spontanous Breathing and Patient connected to face mask oxygen  Post-op Assessment: Report given to RN and Post -op Vital signs reviewed and stable  Post vital signs: Reviewed and stable  Last Vitals:  Vitals Value Taken Time  BP    Temp    Pulse    Resp    SpO2      Last Pain:  Vitals:   08/31/21 1104  TempSrc: Oral  PainSc: 0-No pain         Complications: No notable events documented.

## 2021-09-01 ENCOUNTER — Encounter (HOSPITAL_BASED_OUTPATIENT_CLINIC_OR_DEPARTMENT_OTHER): Payer: Self-pay | Admitting: Orthopedic Surgery

## 2021-09-05 LAB — AEROBIC/ANAEROBIC CULTURE W GRAM STAIN (SURGICAL/DEEP WOUND)

## 2021-11-29 ENCOUNTER — Telehealth: Payer: Self-pay | Admitting: Internal Medicine

## 2021-11-29 NOTE — Telephone Encounter (Signed)
Did try to schedule a follow up as it looks like this rx was discontinued, pt's wife declined.    Medication: lisinopril-hydrochlorothiazide (ZESTORETIC) 10-12.5 MG   Has the patient contacted their pharmacy? No.   Preferred Pharmacy (with phone number or street name): [id number] - [place], [address]  [place], [place] [address]  Phone:  [phone]  Fax:  [phone]

## 2021-11-29 NOTE — Telephone Encounter (Signed)
Please advise. Pt's wife declined to schedule f/u appt.

## 2021-11-30 ENCOUNTER — Other Ambulatory Visit: Payer: Self-pay

## 2021-11-30 MED ORDER — AMLODIPINE BESYLATE 5 MG PO TABS: 5.0000 mg | ORAL_TABLET | Freq: Every day | ORAL | 1 refills | Status: DC

## 2021-11-30 NOTE — Telephone Encounter (Signed)
Jerry Sims, please call the patient:  Last seen 01-2021, at the time he was not taking lisinopril hydrochlorothiazide. Unable to refill.  It would be okay to refill amlodipine 5 mg and Pravachol 1 month supply to give him time to schedule an appointment.

## 2021-11-30 NOTE — Telephone Encounter (Signed)
Patient advised of medication that will be fill is the same one he was taking which was Amlodipine. Patient does not want refill for pravachol at this time due to side effects, will discuss with pcp at ov.   He was scheduled to come in 12-06-2021 for follow up and possible labs. Refill was sent for Amlodipine.

## 2021-12-06 ENCOUNTER — Encounter: Payer: Self-pay | Admitting: Internal Medicine

## 2021-12-06 ENCOUNTER — Ambulatory Visit (INDEPENDENT_AMBULATORY_CARE_PROVIDER_SITE_OTHER): Payer: 59 | Admitting: Internal Medicine

## 2021-12-06 VITALS — BP 135/90 | HR 74 | Temp 97.9°F | Resp 18 | Ht 65.0 in | Wt 197.0 lb

## 2021-12-06 DIAGNOSIS — E785 Hyperlipidemia, unspecified: Secondary | ICD-10-CM | POA: Diagnosis not present

## 2021-12-06 DIAGNOSIS — I10 Essential (primary) hypertension: Secondary | ICD-10-CM

## 2021-12-06 DIAGNOSIS — E119 Type 2 diabetes mellitus without complications: Secondary | ICD-10-CM | POA: Diagnosis not present

## 2021-12-06 MED ORDER — CICLOPIROX 8 % EX SOLN: Freq: Every day | CUTANEOUS | 6 refills | Status: DC

## 2021-12-06 MED ORDER — AMLODIPINE BESYLATE 10 MG PO TABS: 10.0000 mg | ORAL_TABLET | Freq: Every day | ORAL | 0 refills | Status: DC

## 2021-12-06 NOTE — Patient Instructions (Addendum)
Per our records you are due for your diabetic eye exam. Please contact your eye doctor to schedule an appointment. Please have them send copies of your office visit notes to [place]. Our fax number is [phone]. If you need a referral to an eye doctor please let us know.  Apply Penlac to your great toe every day  Increase amlodipine to 10 mg daily  Watch the salt intake.  Check the  blood pressure regularly BP GOAL is between 110/65 and  135/85. If it is consistently higher or lower, let me know    GO TO THE LAB : Get the blood work     [place], [place] Come back for a physical exam by MAY

## 2021-12-06 NOTE — Progress Notes (Signed)
Subjective:    Patient ID: Jerry Sims, male    DOB: 1966-08-10, 56 y.o.   MRN: [id number]  DOS:  12/06/2021 Type of visit - description: Routine checkup  Since the last office visit is doing well. BP noted to be elevated, reports no chest pain or difficulty breathing, no headache or dizziness.  Also for the several last months, the right great toe seems dystrophic. Fungal infection?  Review of Systems See above   Past Medical History:  Diagnosis Date   At risk for sleep apnea    STOP-BANG= 4     SENT TO PCP 05-29-2014   Extensor tenosynovitis of right wrist    GERD (gastroesophageal reflux disease)    Hyperlipidemia    Hypertension    Wears glasses     Past Surgical History:  Procedure Laterality Date   CARPAL TUNNEL RELEASE Right 2010   DORSAL COMPARTMENT RELEASE Right 06/05/2014   Procedure:  RIGHT WRIST FIRST/SECOND DORSAL COMPARTMENT TENOSYNOVECTOMY;  Surgeon: Linna Hoff, MD;  Location: [facility];  Service: Orthopedics;  Laterality: Right;   INCISION AND DRAINAGE Right 08/31/2021   Procedure: INCISION AND DRAINAGE RIGHT THUMB;  Surgeon: Leanora Cover, MD;  Location: [facility];  Service: Orthopedics;  Laterality: Right;  30 MIN   NASAL SEPTUM SURGERY  2014    Current Outpatient Medications  Medication Instructions   amLODipine (NORVASC) 5 mg, Oral, Daily   doxycycline (VIBRAMYCIN) 100 mg, Oral, 2 times daily   HYDROcodone-acetaminophen (NORCO/VICODIN) 5-325 MG tablet 1-2 tabs PO q6 hours prn pain   pravastatin (PRAVACHOL) 40 mg, Oral, Daily       Objective:   Physical Exam BP (!) 162/108 (BP Location: Left Arm, Patient Position: Sitting, Cuff Size: Small)    Pulse 74    Temp 97.9 F (36.6 C) (Oral)    Resp 18    Ht 5\' 5"  (1.651 m)    Wt 197 lb (89.4 kg)    SpO2 96%    BMI 32.78 kg/m  General:   Well developed, NAD, BMI noted. HEENT:  Normocephalic . Face symmetric, atraumatic Lungs:  CTA B Normal respiratory effort,  no intercostal retractions, no accessory muscle use. Heart: RRR,  no murmur.  Lower extremities: no pretibial edema bilaterally   R great toe nail: Mild dystrophy on the sides. Neurologic:  alert & oriented X3.  Speech normal, gait appropriate for age and unassisted Psych--  Cognition and judgment appear intact.  Cooperative with normal attention span and concentration.  Behavior appropriate. No anxious or depressed appearing.      Assessment    Assessment    DM A1c 6.5 2019  HTN Dyslipidemia LDL 182 ----> 11-2014, intolerant to Lipitor 06-2015  GERD Snoring, at risk of OSA per stop bang 05-2014 : denied fatigue, snoring to me COVID-19: February 2021, had monoclonal antibody infusion. Diverticulitis: First episode 08-2020.  PLAN DM: Diet controlled, reports he remains very active at work, has improved his diet.  Check A1c HTN: Upon arrival BP was elevated, I recheck and obtain 135/90 R arm. Reports follows a low-salt diet and BPs check at home are okay.  BP is elevated today, no symptoms.  Plan: BMP, increase amlodipine to 10 mg.  Monitor at home, see AVS. High cholesterol: Was prescribed Pravachol, reports intolerance due to dyspepsia.  Same thing happened with atorvastatin.  We will recheck FLP on RTC. Onychomycosis: Most likely dystrophy of the right great nail is onychomycosis, very mild, we discussed oral  versus topicals, elected topicals: Penlac, daily, x1 year. RTC 3 months CPX    This visit occurred during the SARS-CoV-2 public health emergency.  Safety protocols were in place, including screening questions prior to the visit, additional usage of staff PPE, and extensive cleaning of exam room while observing appropriate contact time as indicated for disinfecting solutions.

## 2021-12-07 LAB — BASIC METABOLIC PANEL
BUN: 15 mg/dL (ref 6–23)
CO2: 34 mEq/L — ABNORMAL HIGH (ref 19–32)
Calcium: 9.6 mg/dL (ref 8.4–10.5)
Chloride: 101 mEq/L (ref 96–112)
Creatinine, Ser: 1.09 mg/dL (ref 0.40–1.50)
GFR: 75.35 mL/min (ref >60.00)
Glucose, Bld: 104 mg/dL — ABNORMAL HIGH (ref 70–99)
Potassium: 3.7 mEq/L (ref 3.5–5.1)
Sodium: 139 mEq/L (ref 135–145)

## 2021-12-07 LAB — MICROALBUMIN / CREATININE URINE RATIO
Creatinine,U: 174.6 mg/dL
Microalb Creat Ratio: 0.6 mg/g (ref 0.0–30.0)
Microalb, Ur: 1 mg/dL (ref 0.0–1.9)

## 2021-12-07 LAB — HEMOGLOBIN A1C: Hgb A1c MFr Bld: 6.2 % (ref 4.6–6.5)

## 2021-12-07 NOTE — Assessment & Plan Note (Signed)
DM: Diet controlled, reports he remains very active at work, has improved his diet.  Check A1c HTN: Upon arrival BP was elevated, I recheck and obtain 135/90 R arm. Reports follows a low-salt diet and BPs check at home are okay.  BP is elevated today, no symptoms.  Plan: BMP, increase amlodipine to 10 mg.  Monitor at home, see AVS. High cholesterol: Was prescribed Pravachol, reports intolerance due to dyspepsia.  Same thing happened with atorvastatin.  We will recheck FLP on RTC. Onychomycosis: Most likely dystrophy of the right great nail is onychomycosis, very mild, we discussed oral versus topicals, elected topicals: Penlac, daily, x1 year. RTC 3 months CPX

## 2022-03-09 ENCOUNTER — Other Ambulatory Visit: Payer: Self-pay | Admitting: Internal Medicine

## 2022-03-23 ENCOUNTER — Other Ambulatory Visit: Payer: Self-pay | Admitting: Internal Medicine

## 2022-05-18 ENCOUNTER — Encounter: Payer: Self-pay | Admitting: Internal Medicine

## 2022-05-18 ENCOUNTER — Ambulatory Visit (INDEPENDENT_AMBULATORY_CARE_PROVIDER_SITE_OTHER): Payer: 59 | Admitting: Internal Medicine

## 2022-05-18 VITALS — BP 162/108 | HR 71 | Temp 98.3°F | Resp 18 | Ht 65.0 in | Wt 195.5 lb

## 2022-05-18 DIAGNOSIS — I10 Essential (primary) hypertension: Secondary | ICD-10-CM | POA: Diagnosis not present

## 2022-05-18 DIAGNOSIS — J029 Acute pharyngitis, unspecified: Secondary | ICD-10-CM | POA: Diagnosis not present

## 2022-05-18 DIAGNOSIS — Z91199 Patient's noncompliance with other medical treatment and regimen due to unspecified reason: Secondary | ICD-10-CM

## 2022-05-18 LAB — POC COVID19 BINAXNOW: SARS Coronavirus 2 Ag: NEGATIVE

## 2022-05-18 LAB — POCT RAPID STREP A (OFFICE): Rapid Strep A Screen: NEGATIVE

## 2022-05-18 MED ORDER — AMLODIPINE BESYLATE 10 MG PO TABS
10.0000 mg | ORAL_TABLET | Freq: Every day | ORAL | 0 refills | Status: AC
Start: 2022-05-18 — End: ?

## 2022-05-18 MED ORDER — AMOXICILLIN 875 MG PO TABS: 875.0000 mg | ORAL_TABLET | Freq: Two times a day (BID) | ORAL | 0 refills | Status: DC

## 2022-05-18 NOTE — Progress Notes (Unsigned)
Subjective:    Patient ID: Jerry Sims, male    DOB: [birth year], 56 y.o.   MRN: [id number]  DOS:  05/18/2022 Type of visit - description: acute  Symptoms started 2 days ago: Moderate sore throat associated with joint aches. Denies any sick contacts. + Subjective fever and some chills. No sinus congestion No cough or chest congestion No nausea vomiting No headaches   Review of Systems See above   Past Medical History:  Diagnosis Date   At risk for sleep apnea    STOP-BANG= 4     SENT TO PCP 05-29-2014   Extensor tenosynovitis of right wrist    GERD (gastroesophageal reflux disease)    Hyperlipidemia    Hypertension    Wears glasses     Past Surgical History:  Procedure Laterality Date   CARPAL TUNNEL RELEASE Right 2010   DORSAL COMPARTMENT RELEASE Right 06/05/2014   Procedure:  RIGHT WRIST FIRST/SECOND DORSAL COMPARTMENT TENOSYNOVECTOMY;  Surgeon: Linna Hoff, MD;  Location: [facility];  Service: Orthopedics;  Laterality: Right;   INCISION AND DRAINAGE Right 08/31/2021   Procedure: INCISION AND DRAINAGE RIGHT THUMB;  Surgeon: Leanora Cover, MD;  Location: [facility];  Service: Orthopedics;  Laterality: Right;  30 MIN   NASAL SEPTUM SURGERY  2014    Current Outpatient Medications  Medication Instructions   amLODipine (NORVASC) 10 MG tablet TAKE 1 TABLET BY MOUTH EVERY DAY   ciclopirox (PENLAC) 8 % solution Topical, Daily at bedtime, Apply over nail and surrounding skin. Apply daily over previous coat. After seven (7) days, may remove with alcohol and continue cycle.       Objective:   Physical Exam BP (!) 161/110   Pulse 71   Temp 98.3 F (36.8 C) (Oral)   Resp 18   Ht '5\' 5"'$  (1.651 m)   Wt 195 lb 8 oz (88.7 kg)   SpO2 98%   BMI 32.53 kg/m  General:   Well developed, NAD, BMI noted. HEENT:  Normocephalic . Face symmetric, atraumatic TMs: Normal Throat: Definitely red, tonsils normal size, no white patches, able to  swallow saliva without problems. Neck: Few small lymphadenopathies in both sides, slightly tender Lungs:  CTA B Normal respiratory effort, no intercostal retractions, no accessory muscle use. Heart: RRR,  no murmur.  Lower extremities: no pretibial edema bilaterally  Skin: Not pale. Not jaundice Neurologic:  alert & oriented X3.  Speech normal, gait appropriate for age and unassisted Psych--  Cognition and judgment appear intact.  Cooperative with normal attention span and concentration.  Behavior appropriate. No anxious or depressed appearing.      Assessment     Assessment    DM A1c 6.5 2019  HTN Dyslipidemia LDL 182 ----> 11-2014, intolerant to Lipitor 06-2015  GERD Snoring, at risk of OSA per stop bang 05-2014 : denied fatigue, snoring to me COVID-19: February 2021, had monoclonal antibody infusion. Diverticulitis: First episode 08-2020.  PLAN Pharyngitis: Symptoms started 2 days ago, COVID test and rapid strep test negative. On clinical grounds he has pharyngitis, plan: Empiric amoxicillin, just to be sure I will ask him to recheck for COVID in 2 days and let me know if positive. HTN: BP upon arrival elevated, ran out of amlodipine about 2 weeks ago.  BP recheck 162/108 Strongly recommend good compliance with meds, RF sent, RTC CPX in 1 month 2-20 DM: Diet controlled, reports he remains very active at work, has improved his diet.  Check A1c HTN:  Upon arrival BP was elevated, I recheck and obtain 135/90 R arm. Reports follows a low-salt diet and BPs check at home are okay.  BP is elevated today, no symptoms.  Plan: BMP, increase amlodipine to 10 mg.  Monitor at home, see AVS. High cholesterol: Was prescribed Pravachol, reports intolerance due to dyspepsia.  Same thing happened with atorvastatin.  We will recheck FLP on RTC. Onychomycosis: Most likely dystrophy of the right great nail is onychomycosis, very mild, we discussed oral versus topicals, elected topicals: Penlac,  daily, x1 year. RTC 3 months CPX

## 2022-05-18 NOTE — Patient Instructions (Addendum)
Start an antibiotic called amoxicillin 1 tablet twice daily for 10 days.  Finish the antibiotic even if you feel better  In 2 days, please check yourself with the over-the-counter COVID test and call the office if it shows positive.  Rest, drink plenty fluids, get some Tylenol for pain.  If you are not gradually better let us know  Your blood pressure is elevated, is important you take amlodipine every day.  We will send a prescription.  Check the  blood pressure regularly BP GOAL is between 110/65 and  135/85. If it is consistently higher or lower, let me know    [place], [place] back for a physical exam in 1 month    Per our records you are due for your diabetic eye exam. Please contact your eye doctor to schedule an appointment. Please have them send copies of your office visit notes to [place]. Our fax number is [phone]. If you need a referral to an eye doctor please let us know.

## 2022-05-19 NOTE — Assessment & Plan Note (Signed)
Pharyngitis: Symptoms started 2 days ago, COVID test and rapid strep test negative. On clinical grounds he has pharyngitis, plan: Empiric amoxicillin, just to be sure I will ask him to recheck for COVID in 2 days and let me know if positive. HTN: BP upon arrival elevated, ran out of amlodipine about 2 weeks ago.  BP recheck 162/108 Strongly recommend good compliance with meds, RF sent, RTC CPX in 1 month

## 2022-06-29 ENCOUNTER — Ambulatory Visit: Payer: 59 | Admitting: Internal Medicine

## 2022-06-29 ENCOUNTER — Encounter: Payer: Self-pay | Admitting: Internal Medicine

## 2022-07-18 ENCOUNTER — Ambulatory Visit (INDEPENDENT_AMBULATORY_CARE_PROVIDER_SITE_OTHER): Payer: 59 | Admitting: Internal Medicine

## 2022-07-18 ENCOUNTER — Encounter: Payer: Self-pay | Admitting: Internal Medicine

## 2022-07-18 VITALS — BP 140/85 | HR 63 | Temp 98.1°F | Resp 16 | Ht 65.0 in | Wt 196.1 lb

## 2022-07-18 DIAGNOSIS — Z0000 Encounter for general adult medical examination without abnormal findings: Secondary | ICD-10-CM

## 2022-07-18 DIAGNOSIS — E119 Type 2 diabetes mellitus without complications: Secondary | ICD-10-CM | POA: Diagnosis not present

## 2022-07-18 DIAGNOSIS — E785 Hyperlipidemia, unspecified: Secondary | ICD-10-CM | POA: Diagnosis not present

## 2022-07-18 DIAGNOSIS — I10 Essential (primary) hypertension: Secondary | ICD-10-CM | POA: Diagnosis not present

## 2022-07-18 NOTE — Patient Instructions (Addendum)
Per our records you are due for your diabetic eye exam. Please contact your eye doctor to schedule an appointment. Please have them send copies of your office visit notes to [place]. Our fax number is [phone]. If you need a referral to an eye doctor please let us know.  Check the  blood pressure regularly BP GOAL is between 110/65 and  135/85. If it is consistently higher or lower, let me know    GO TO THE LAB : Get the blood work     [place], [place] back for a checkup in 3 months

## 2022-07-18 NOTE — Progress Notes (Signed)
Subjective:    Patient ID: Jerry Sims, male    DOB: [birth year], 56 y.o.   MRN: [id number]  DOS:  07/18/2022 Type of visit - description: CPX  Here for CPX. At the last visit, BP was elevated in the context of not taking amlodipine. Reports good amlodipine compliance, no ambulatory BPs. He denies chest pain, difficulty breathing or lower extremity edema  Review of Systems  Other than above, a 14 point review of systems is negative     Past Medical History:  Diagnosis Date   At risk for sleep apnea    STOP-BANG= 4     SENT TO PCP 05-29-2014   Extensor tenosynovitis of right wrist    GERD (gastroesophageal reflux disease)    Hyperlipidemia    Hypertension    Wears glasses     Past Surgical History:  Procedure Laterality Date   CARPAL TUNNEL RELEASE Right 2010   DORSAL COMPARTMENT RELEASE Right 06/05/2014   Procedure:  RIGHT WRIST FIRST/SECOND DORSAL COMPARTMENT TENOSYNOVECTOMY;  Surgeon: Linna Hoff, MD;  Location: [facility];  Service: Orthopedics;  Laterality: Right;   INCISION AND DRAINAGE Right 08/31/2021   Procedure: INCISION AND DRAINAGE RIGHT THUMB;  Surgeon: Leanora Cover, MD;  Location: [facility];  Service: Orthopedics;  Laterality: Right;  30 MIN   NASAL SEPTUM SURGERY  2014   Social History   Socioeconomic History   Marital status: Married    Spouse name: Not on file   Number of children: 2   Years of education: Not on file   Highest education level: Not on file  Occupational History   Occupation: Architect, Sales executive   Tobacco Use   Smoking status: Never   Smokeless tobacco: Never  Substance and Sexual Activity   Alcohol use: Yes    Comment: OCCASIONAL   Drug use: No   Sexual activity: Not on file  Other Topics Concern   Not on file  Social History Narrative   Household-- pt, wife and his daughter    Original from [place]   Social Determinants of Health   Financial Resource Strain: Not on file  Food  Insecurity: Not on file  Transportation Needs: Not on file  Physical Activity: Not on file  Stress: Not on file  Social Connections: Not on file  Intimate Partner Violence: Not on file    Current Outpatient Medications  Medication Instructions   amLODipine (NORVASC) 10 mg, Oral, Daily       Objective:   Physical Exam BP (!) 140/85   Pulse 63   Temp 98.1 F (36.7 C) (Oral)   Resp 16   Ht '5\' 5"'$  (1.651 m)   Wt 196 lb 2 oz (89 kg)   SpO2 96%   BMI 32.64 kg/m  General: Well developed, NAD, BMI noted Neck: No  thyromegaly  HEENT:  Normocephalic . Face symmetric, atraumatic Lungs:  CTA B Normal respiratory effort, no intercostal retractions, no accessory muscle use. Heart: RRR,  no murmur.  Abdomen:  Not distended, soft, non-tender. No rebound or rigidity.   Lower extremities: no pretibial edema bilaterally DRE: Declined Skin: Exposed areas without rash. Not pale. Not jaundice Neurologic:  alert & oriented X3.  Speech normal, gait appropriate for age and unassisted Strength symmetric and appropriate for age.  Psych: Cognition and judgment appear intact.  Cooperative with normal attention span and concentration.  Behavior appropriate. No anxious or depressed appearing.     Assessment    Assessment  DM A1c 6.5 2019  HTN Dyslipidemia LDL 182 ----> 11-2014, intolerant to Lipitor 06-2015  GERD Snoring, at risk of OSA per stop bang 05-2014 : denied fatigue, snoring to me Diverticulitis: First episode 08-2020.  PLAN Here for CPX DM: Patient aware he has mild diabetes, diet controlled, check A1c HTN: See last visit, BP was elevated, he restarted amlodipine, no ambulatory BPs.  At the office today BP was elevated when he arrived, I recheck on the right arm: 140/85.  Plan: Check BPs at home, labs.  Continue amlodipine.  Reassess in 3 months Dyslipidemia: Intolerant to Lipitor, currently on no meds.  Explained benefits of taking statins.  Recheck FLP, consider  Crestor. RTC: 3 months

## 2022-07-19 ENCOUNTER — Telehealth: Payer: Self-pay

## 2022-07-19 ENCOUNTER — Encounter: Payer: Self-pay | Admitting: Internal Medicine

## 2022-07-19 LAB — LIPID PANEL
Cholesterol: 231 mg/dL — ABNORMAL HIGH (ref 0–200)
HDL: 39.3 mg/dL (ref >39.00)
NonHDL: 191.2
Total CHOL/HDL Ratio: 6
Triglycerides: 266 mg/dL — ABNORMAL HIGH (ref 0.0–149.0)
VLDL: 53.2 mg/dL — ABNORMAL HIGH (ref 0.0–40.0)

## 2022-07-19 LAB — COMPREHENSIVE METABOLIC PANEL
ALT: 33 U/L (ref 0–53)
AST: 22 U/L (ref 0–37)
Albumin: 4.2 g/dL (ref 3.5–5.2)
Alkaline Phosphatase: 112 U/L (ref 39–117)
BUN: 17 mg/dL (ref 6–23)
CO2: 29 mEq/L (ref 19–32)
Calcium: 9.5 mg/dL (ref 8.4–10.5)
Chloride: 103 mEq/L (ref 96–112)
Creatinine, Ser: 1.34 mg/dL (ref 0.40–1.50)
GFR: 58.56 mL/min — ABNORMAL LOW (ref >60.00)
Glucose, Bld: 129 mg/dL — ABNORMAL HIGH (ref 70–99)
Potassium: 4.1 mEq/L (ref 3.5–5.1)
Sodium: 141 mEq/L (ref 135–145)
Total Bilirubin: 0.4 mg/dL (ref 0.2–1.2)
Total Protein: 7 g/dL (ref 6.0–8.3)

## 2022-07-19 LAB — CBC WITH DIFFERENTIAL/PLATELET
Basophils Absolute: 0.1 10*3/uL (ref 0.0–0.1)
Basophils Relative: 1.2 % (ref 0.0–3.0)
Eosinophils Absolute: 0.1 10*3/uL (ref 0.0–0.7)
Eosinophils Relative: 1.2 % (ref 0.0–5.0)
HCT: 42 % (ref 39.0–52.0)
Hemoglobin: 14.3 g/dL (ref 13.0–17.0)
Lymphocytes Relative: 34.7 % (ref 12.0–46.0)
Lymphs Abs: 2.8 10*3/uL (ref 0.7–4.0)
MCHC: 34 g/dL (ref 30.0–36.0)
MCV: 94.1 fl (ref 78.0–100.0)
Monocytes Absolute: 0.6 10*3/uL (ref 0.1–1.0)
Monocytes Relative: 7.6 % (ref 3.0–12.0)
Neutro Abs: 4.4 10*3/uL (ref 1.4–7.7)
Neutrophils Relative %: 55.3 % (ref 43.0–77.0)
Platelets: 250 10*3/uL (ref 150.0–400.0)
RBC: 4.46 Mil/uL (ref 4.22–5.81)
RDW: 14.2 % (ref 11.5–15.5)
WBC: 8 10*3/uL (ref 4.0–10.5)

## 2022-07-19 LAB — TSH: TSH: 1.96 u[IU]/mL (ref 0.35–5.50)

## 2022-07-19 LAB — LDL CHOLESTEROL, DIRECT: Direct LDL: 150 mg/dL

## 2022-07-19 LAB — HEMOGLOBIN A1C: Hgb A1c MFr Bld: 6.5 % (ref 4.6–6.5)

## 2022-07-19 LAB — PSA: PSA: 0.57 ng/mL (ref 0.10–4.00)

## 2022-07-19 NOTE — Assessment & Plan Note (Signed)
Here for CPX DM: Patient aware he has mild diabetes, diet controlled, check A1c HTN: See last visit, BP was elevated, he restarted amlodipine, no ambulatory BPs.  At the office today BP was elevated when he arrived, I recheck on the right arm: 140/85.  Plan: Check BPs at home, labs.  Continue amlodipine.  Reassess in 3 months Dyslipidemia: Intolerant to Lipitor, currently on no meds.  Explained benefits of taking statins.  Recheck FLP, consider Crestor. RTC: 3 months

## 2022-07-19 NOTE — Telephone Encounter (Signed)
Physical form completed and faxed back to [facility] at [phone] and [phone]. Form sent for scanning.

## 2022-07-19 NOTE — Assessment & Plan Note (Signed)
--  Td 2011 has declined all vaccines consistently; pro>>cons d/w pt --CCS : Colonoscopy 07-2018, next 10 years per GI letter --Prostate cancer screening : declined DRE, check a PSA --Lifestyle: Encouraged heart healthy diet. -Labs: CMP FLP CBC A1c TSH PSA.  Okay to call results to the patient or his wife Jerry Sims -Healthcare power of attorney information provided.

## 2022-07-19 NOTE — Telephone Encounter (Signed)
Received fax confirmation

## 2022-07-21 MED ORDER — ROSUVASTATIN CALCIUM 5 MG PO TABS: 5.0000 mg | ORAL_TABLET | Freq: Every day | ORAL | 5 refills | Status: AC

## 2022-07-21 NOTE — Addendum Note (Signed)
Addended byDamita Dunnings D on: 07/21/2022 08:31 AM   Modules accepted: Orders

## 2022-08-31 NOTE — Telephone Encounter (Signed)
Form emailed to Pt's wife as requested.

## 2022-08-31 NOTE — Telephone Encounter (Signed)
Pt wife called and wanted an update on the form that was faxed for the CPE. I have informed her that it was faxed and confirmed a month ago.   She would like a copy sent to her email at [email or web address]

## 2022-10-24 IMAGING — CT CT ABD-PELV W/ CM
2 of 5 series · 16 of 46 positions shown, 18 images · IV contrast (Omnipaque)
Comparison: None.

CLINICAL DATA: Abdominal pain for several days

EXAM:
CT ABDOMEN AND PELVIS WITH CONTRAST
TECHNIQUE: Multidetector CT imaging of the abdomen and pelvis was performed
using the standard protocol following bolus administration of
intravenous contrast.
CONTRAST:  100mL OMNIPAQUE IOHEXOL 300 MG/ML  SOLN

[Series 2: axial st · axial · 0.87mm/px · z∈[+492,+932]mm · 13 of 100 slices shown, 15 images]
[im 6/100  soft-tissue]
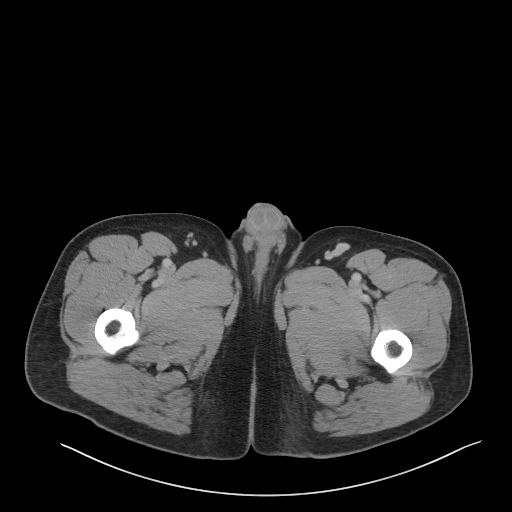
[im 6/100  bone]
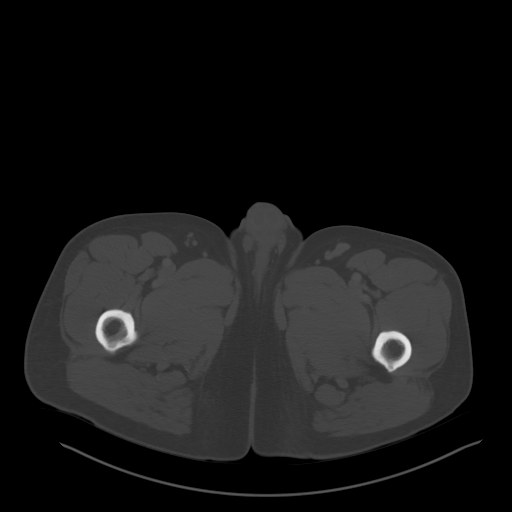
[im 16/100  soft-tissue]
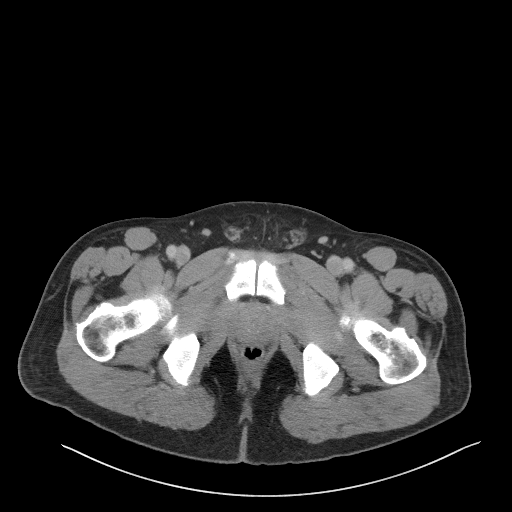
[im 21/100  soft-tissue]
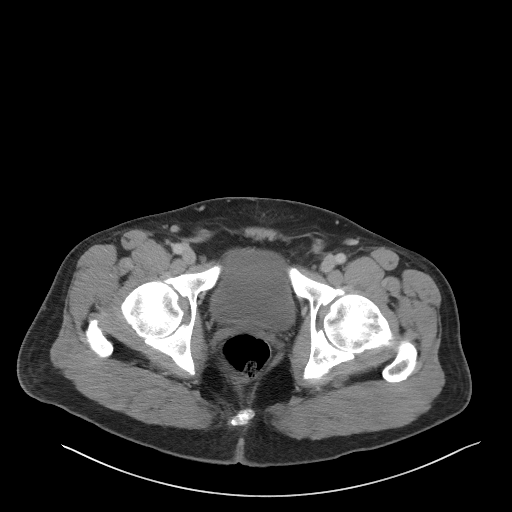
[im 27/100  soft-tissue]
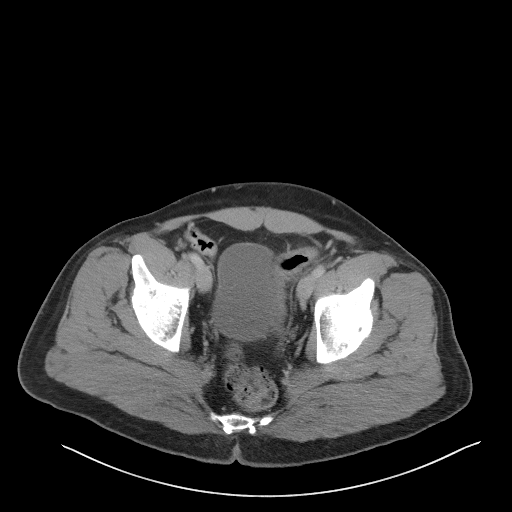
[im 37/100  soft-tissue]
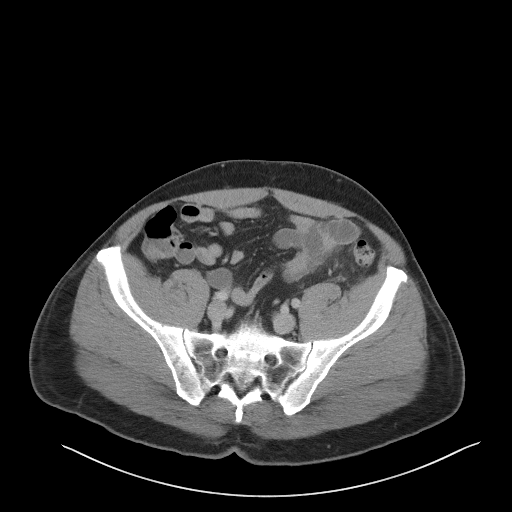
[im 42/100  soft-tissue]
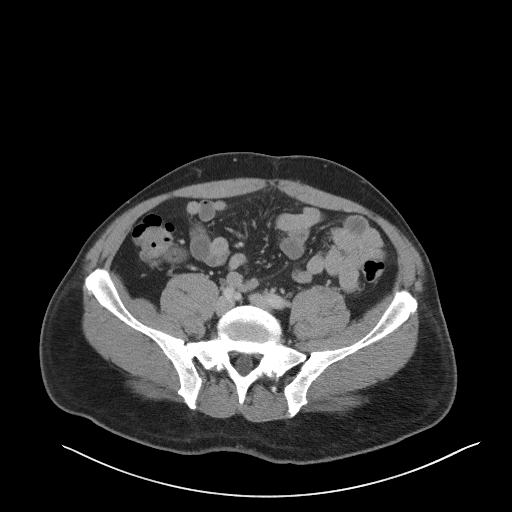
[im 53/100  soft-tissue]
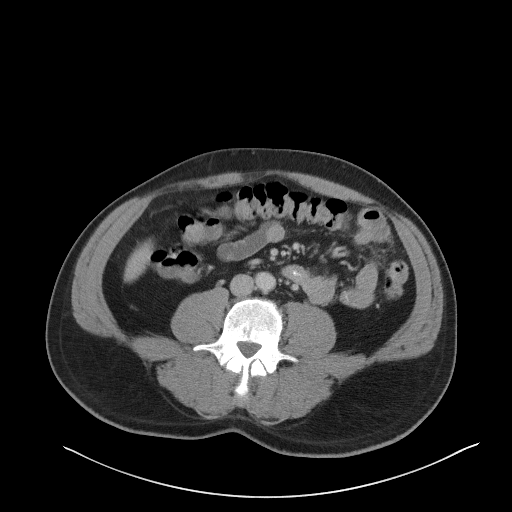
[im 58/100  soft-tissue]
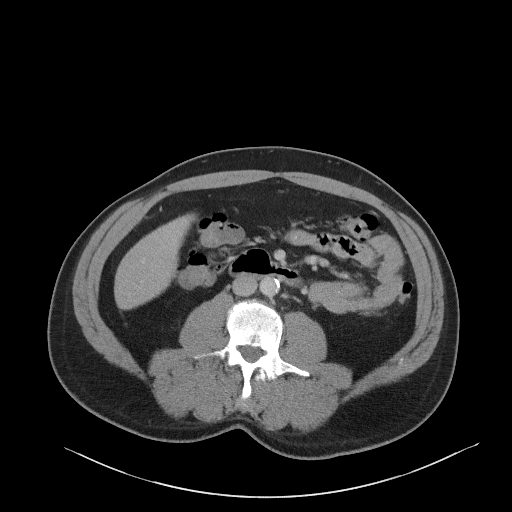
[im 63/100  soft-tissue]
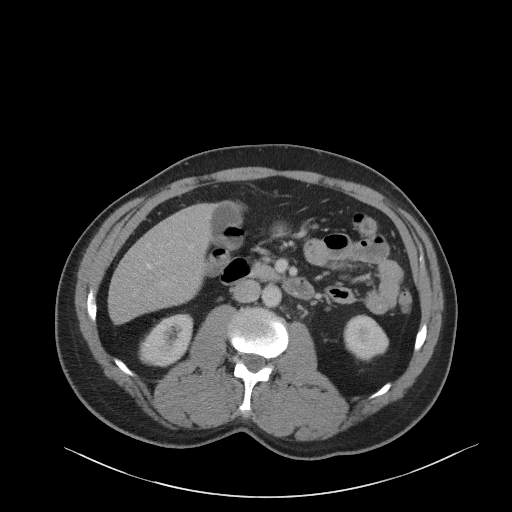
[im 63/100  bone]
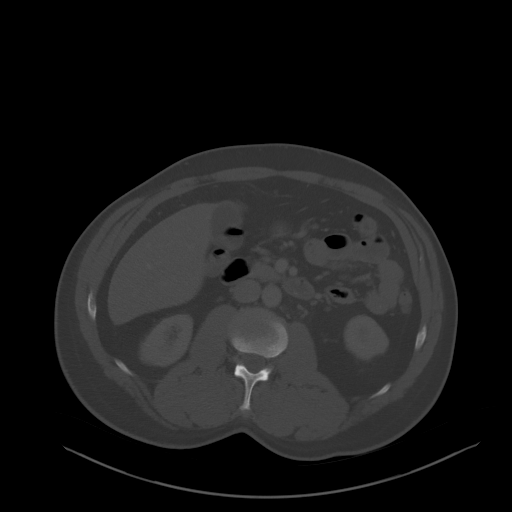
[im 73/100  soft-tissue]
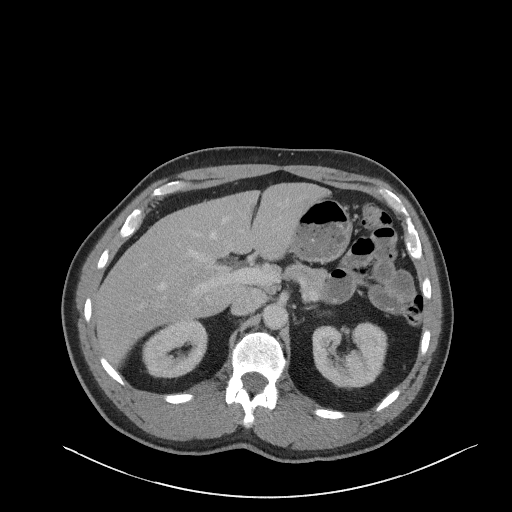
[im 79/100  soft-tissue]
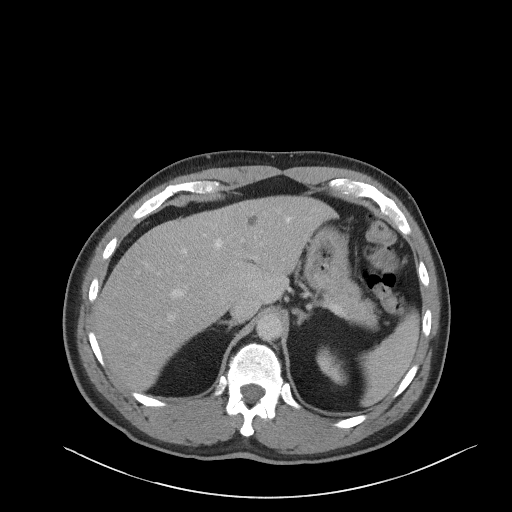
[im 84/100  soft-tissue]
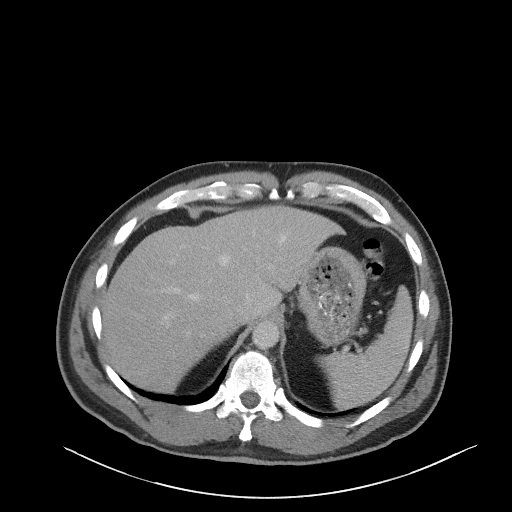
[im 94/100  soft-tissue]
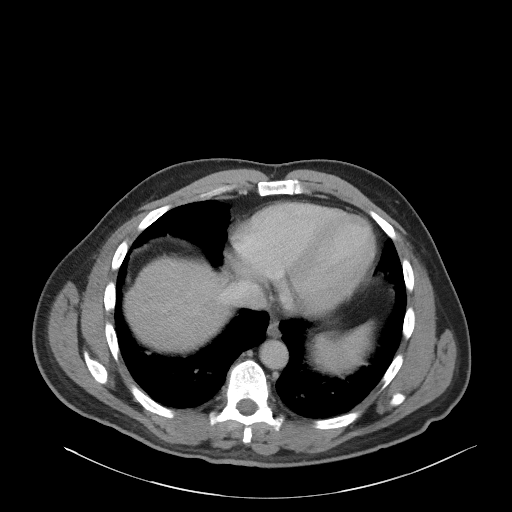

[Series 5: coronal st · coronal · 0.77mm/px · 3 of 97 slices shown]
[im 33/97  soft-tissue]
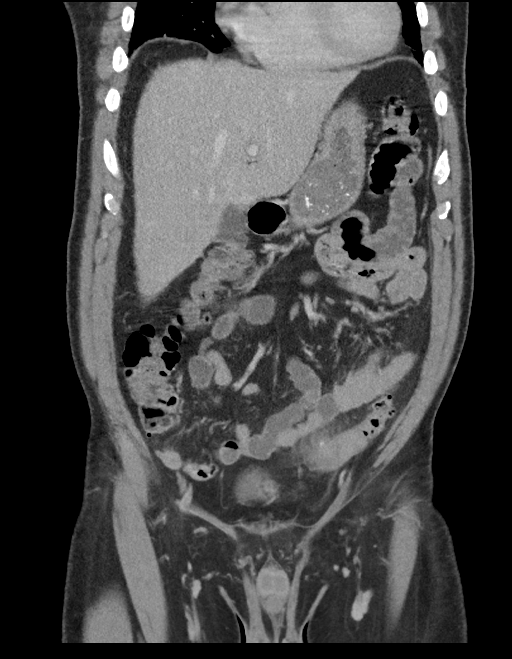
[im 43/97  soft-tissue]
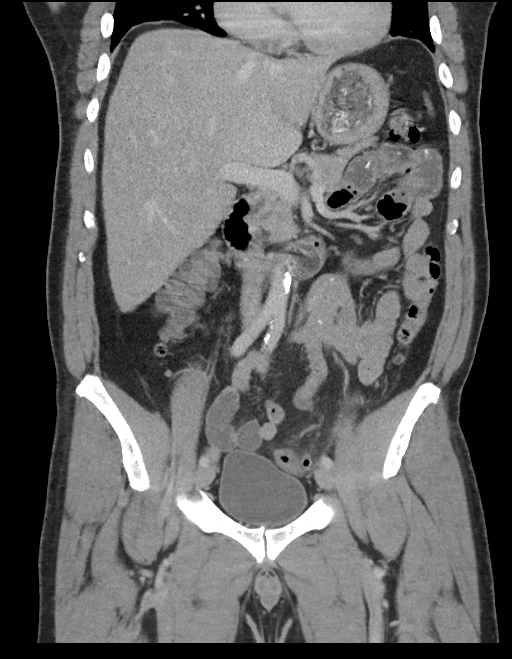
[im 54/97  soft-tissue]
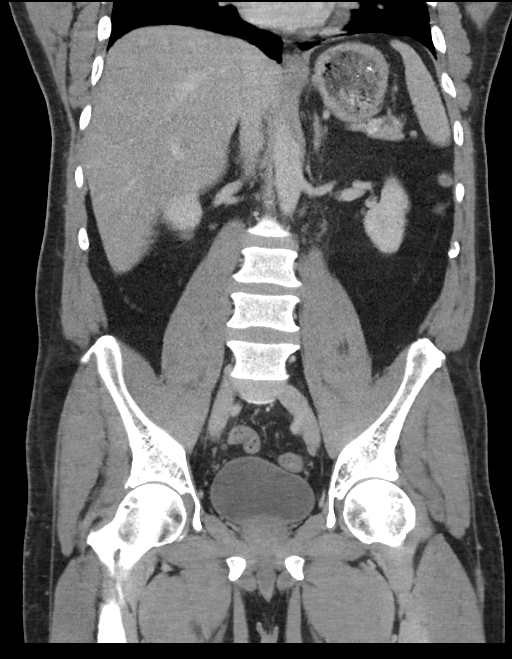

[16 of 46 positions shown; findings below may reference images not displayed]

FINDINGS: Lower chest: No acute abnormality.

Hepatobiliary: A few small cysts are noted within the liver. Mild
fatty infiltration is seen. Gallbladder is within normal limits.

Pancreas: Unremarkable. No pancreatic ductal dilatation or
surrounding inflammatory changes.

Spleen: Normal in size without focal abnormality.

Adrenals/Urinary Tract: Adrenal glands are within normal limits.
Kidneys demonstrate a normal enhancement pattern bilaterally. No
renal calculi or obstructive changes are seen. Normal excretion of
contrast is noted on delayed images. Ureters are within normal
limits. Bladder is partially distended.

Stomach/Bowel: Colon shows evidence of diverticulitis within the
distal descending colon and proximal sigmoid colon. No abscess or
perforation is noted. The more proximal colon appears within normal
limits. The appendix is unremarkable. Small bowel shows no focal
abnormality. The stomach is unremarkable as well.

Vascular/Lymphatic: Aortic atherosclerosis. No enlarged abdominal or
pelvic lymph nodes.

Reproductive: Prostate is unremarkable.

Other: No abdominal wall hernia or abnormality. No abdominopelvic
ascites.

Musculoskeletal: No acute abnormality noted.
IMPRESSION: Changes of diverticulitis as described without evidence of
perforation or abscess formation.

Scattered hepatic cysts.

## 2024-05-15 ENCOUNTER — Encounter: Payer: Self-pay | Admitting: Internal Medicine
